# Patient Record
Sex: Female | Born: 1965 | Race: White | Hispanic: No | Smoking: Never smoker
Health system: Southern US, Community
[De-identification: ages and names within clinical notes are randomized; demographics above are authoritative.]

## PROBLEM LIST (undated history)

## (undated) DIAGNOSIS — N920 Excessive and frequent menstruation with regular cycle: Secondary | ICD-10-CM

## (undated) DIAGNOSIS — K649 Unspecified hemorrhoids: Secondary | ICD-10-CM

## (undated) DIAGNOSIS — R112 Nausea with vomiting, unspecified: Secondary | ICD-10-CM

## (undated) DIAGNOSIS — Z9889 Other specified postprocedural states: Secondary | ICD-10-CM

## (undated) DIAGNOSIS — J45909 Unspecified asthma, uncomplicated: Secondary | ICD-10-CM

## (undated) HISTORY — DX: Excessive and frequent menstruation with regular cycle: N92.0

## (undated) HISTORY — DX: Other specified postprocedural states: R11.2

## (undated) HISTORY — PX: TUBAL LIGATION: SHX77

## (undated) HISTORY — DX: Other specified postprocedural states: Z98.890

## (undated) HISTORY — DX: Unspecified asthma, uncomplicated: J45.909

## (undated) HISTORY — DX: Unspecified hemorrhoids: K64.9

---

## 1997-08-07 HISTORY — PX: AUGMENTATION MAMMAPLASTY: SUR837

## 1998-07-09 ENCOUNTER — Other Ambulatory Visit: Admission: RE | Admit: 1998-07-09 | Discharge: 1998-07-09 | Payer: Self-pay | Admitting: Gynecology

## 1998-08-07 HISTORY — PX: CERVICAL CERCLAGE: SHX1329

## 1998-09-27 ENCOUNTER — Inpatient Hospital Stay (HOSPITAL_COMMUNITY): Admission: AD | Admit: 1998-09-27 | Discharge: 1998-09-30 | Payer: Self-pay | Admitting: Obstetrics and Gynecology

## 1999-02-04 ENCOUNTER — Inpatient Hospital Stay (HOSPITAL_COMMUNITY): Admission: AD | Admit: 1999-02-04 | Discharge: 1999-02-07 | Payer: Self-pay | Admitting: Gynecology

## 1999-03-21 ENCOUNTER — Other Ambulatory Visit: Admission: RE | Admit: 1999-03-21 | Discharge: 1999-03-21 | Payer: Self-pay | Admitting: Obstetrics and Gynecology

## 1999-06-14 ENCOUNTER — Other Ambulatory Visit: Admission: RE | Admit: 1999-06-14 | Discharge: 1999-06-14 | Payer: Self-pay | Admitting: Obstetrics and Gynecology

## 2000-11-01 ENCOUNTER — Other Ambulatory Visit: Admission: RE | Admit: 2000-11-01 | Discharge: 2000-11-01 | Payer: Self-pay | Admitting: Gynecology

## 2000-12-25 ENCOUNTER — Ambulatory Visit (HOSPITAL_COMMUNITY): Admission: RE | Admit: 2000-12-25 | Discharge: 2000-12-25 | Payer: Self-pay | Admitting: Gynecology

## 2001-06-19 ENCOUNTER — Encounter (INDEPENDENT_AMBULATORY_CARE_PROVIDER_SITE_OTHER): Payer: Self-pay | Admitting: *Deleted

## 2001-06-19 ENCOUNTER — Inpatient Hospital Stay (HOSPITAL_COMMUNITY): Admission: AD | Admit: 2001-06-19 | Discharge: 2001-06-21 | Payer: Self-pay | Admitting: Gynecology

## 2001-07-29 ENCOUNTER — Other Ambulatory Visit: Admission: RE | Admit: 2001-07-29 | Discharge: 2001-07-29 | Payer: Self-pay | Admitting: Gynecology

## 2001-08-01 ENCOUNTER — Ambulatory Visit (HOSPITAL_COMMUNITY): Admission: RE | Admit: 2001-08-01 | Discharge: 2001-08-01 | Payer: Self-pay | Admitting: Gynecology

## 2002-03-11 ENCOUNTER — Encounter: Payer: Self-pay | Admitting: Gynecology

## 2002-03-11 ENCOUNTER — Ambulatory Visit (HOSPITAL_COMMUNITY): Admission: RE | Admit: 2002-03-11 | Discharge: 2002-03-11 | Payer: Self-pay | Admitting: Gynecology

## 2002-07-21 ENCOUNTER — Encounter: Payer: Self-pay | Admitting: Emergency Medicine

## 2002-07-21 ENCOUNTER — Emergency Department (HOSPITAL_COMMUNITY): Admission: EM | Admit: 2002-07-21 | Discharge: 2002-07-21 | Payer: Self-pay | Admitting: Emergency Medicine

## 2002-12-30 ENCOUNTER — Other Ambulatory Visit: Admission: RE | Admit: 2002-12-30 | Discharge: 2002-12-30 | Payer: Self-pay | Admitting: Gynecology

## 2003-02-03 ENCOUNTER — Encounter: Admission: RE | Admit: 2003-02-03 | Discharge: 2003-02-03 | Payer: Self-pay | Admitting: Gynecology

## 2003-02-03 ENCOUNTER — Encounter: Payer: Self-pay | Admitting: Gynecology

## 2004-02-25 ENCOUNTER — Other Ambulatory Visit: Admission: RE | Admit: 2004-02-25 | Discharge: 2004-02-25 | Payer: Self-pay | Admitting: Gynecology

## 2004-12-20 ENCOUNTER — Ambulatory Visit: Payer: Self-pay | Admitting: Endocrinology

## 2005-05-12 ENCOUNTER — Other Ambulatory Visit: Admission: RE | Admit: 2005-05-12 | Discharge: 2005-05-12 | Payer: Self-pay | Admitting: Gynecology

## 2005-05-22 ENCOUNTER — Ambulatory Visit (HOSPITAL_COMMUNITY): Admission: RE | Admit: 2005-05-22 | Discharge: 2005-05-22 | Payer: Self-pay | Admitting: Gynecology

## 2006-05-22 ENCOUNTER — Other Ambulatory Visit: Admission: RE | Admit: 2006-05-22 | Discharge: 2006-05-22 | Payer: Self-pay | Admitting: Gynecology

## 2007-06-20 ENCOUNTER — Other Ambulatory Visit: Admission: RE | Admit: 2007-06-20 | Discharge: 2007-06-20 | Payer: Self-pay | Admitting: Gynecology

## 2007-09-18 ENCOUNTER — Telehealth (INDEPENDENT_AMBULATORY_CARE_PROVIDER_SITE_OTHER): Payer: Self-pay | Admitting: *Deleted

## 2007-09-19 ENCOUNTER — Encounter: Payer: Self-pay | Admitting: Endocrinology

## 2007-09-19 ENCOUNTER — Ambulatory Visit: Payer: Self-pay | Admitting: Endocrinology

## 2007-09-19 DIAGNOSIS — F419 Anxiety disorder, unspecified: Secondary | ICD-10-CM | POA: Insufficient documentation

## 2007-09-19 DIAGNOSIS — R51 Headache: Secondary | ICD-10-CM | POA: Insufficient documentation

## 2007-09-19 DIAGNOSIS — F411 Generalized anxiety disorder: Secondary | ICD-10-CM

## 2007-09-19 DIAGNOSIS — R519 Headache, unspecified: Secondary | ICD-10-CM | POA: Insufficient documentation

## 2007-09-19 DIAGNOSIS — R079 Chest pain, unspecified: Secondary | ICD-10-CM | POA: Insufficient documentation

## 2007-10-25 ENCOUNTER — Encounter: Payer: Self-pay | Admitting: Endocrinology

## 2008-03-30 ENCOUNTER — Telehealth: Payer: Self-pay | Admitting: Endocrinology

## 2008-03-31 ENCOUNTER — Ambulatory Visit: Payer: Self-pay | Admitting: Endocrinology

## 2008-04-10 ENCOUNTER — Encounter (INDEPENDENT_AMBULATORY_CARE_PROVIDER_SITE_OTHER): Payer: Self-pay | Admitting: *Deleted

## 2008-04-10 ENCOUNTER — Telehealth: Payer: Self-pay | Admitting: Endocrinology

## 2008-05-18 ENCOUNTER — Ambulatory Visit: Payer: Self-pay | Admitting: Endocrinology

## 2008-05-19 LAB — CONVERTED CEMR LAB: TSH: 0.83 microintl units/mL (ref 0.35–5.50)

## 2008-06-12 ENCOUNTER — Telehealth (INDEPENDENT_AMBULATORY_CARE_PROVIDER_SITE_OTHER): Payer: Self-pay | Admitting: *Deleted

## 2008-07-16 ENCOUNTER — Other Ambulatory Visit: Admission: RE | Admit: 2008-07-16 | Discharge: 2008-07-16 | Payer: Self-pay | Admitting: Gynecology

## 2008-07-16 ENCOUNTER — Ambulatory Visit: Payer: Self-pay | Admitting: Women's Health

## 2008-07-16 ENCOUNTER — Encounter: Payer: Self-pay | Admitting: Gynecology

## 2008-07-20 ENCOUNTER — Ambulatory Visit (HOSPITAL_COMMUNITY): Admission: RE | Admit: 2008-07-20 | Discharge: 2008-07-20 | Payer: Self-pay | Admitting: Gynecology

## 2009-04-23 ENCOUNTER — Telehealth: Payer: Self-pay | Admitting: Endocrinology

## 2009-04-26 ENCOUNTER — Ambulatory Visit: Payer: Self-pay | Admitting: Endocrinology

## 2009-04-26 DIAGNOSIS — K5900 Constipation, unspecified: Secondary | ICD-10-CM | POA: Insufficient documentation

## 2009-07-21 ENCOUNTER — Ambulatory Visit (HOSPITAL_COMMUNITY): Admission: RE | Admit: 2009-07-21 | Discharge: 2009-07-21 | Payer: Self-pay | Admitting: Gynecology

## 2009-08-04 ENCOUNTER — Ambulatory Visit: Payer: Self-pay | Admitting: Women's Health

## 2009-08-04 ENCOUNTER — Other Ambulatory Visit: Admission: RE | Admit: 2009-08-04 | Discharge: 2009-08-04 | Payer: Self-pay | Admitting: Gynecology

## 2010-07-27 ENCOUNTER — Ambulatory Visit (HOSPITAL_COMMUNITY)
Admission: RE | Admit: 2010-07-27 | Discharge: 2010-07-27 | Payer: Self-pay | Source: Home / Self Care | Attending: Gynecology | Admitting: Gynecology

## 2010-08-10 ENCOUNTER — Other Ambulatory Visit
Admission: RE | Admit: 2010-08-10 | Discharge: 2010-08-10 | Payer: Self-pay | Source: Home / Self Care | Admitting: Gynecology

## 2010-08-10 ENCOUNTER — Ambulatory Visit
Admission: RE | Admit: 2010-08-10 | Discharge: 2010-08-10 | Payer: Self-pay | Source: Home / Self Care | Attending: Women's Health | Admitting: Women's Health

## 2010-08-19 ENCOUNTER — Ambulatory Visit
Admission: RE | Admit: 2010-08-19 | Discharge: 2010-08-19 | Payer: Self-pay | Source: Home / Self Care | Attending: Gynecology | Admitting: Gynecology

## 2010-09-14 ENCOUNTER — Institutional Professional Consult (permissible substitution): Payer: Self-pay | Admitting: Gynecology

## 2010-09-21 ENCOUNTER — Ambulatory Visit (HOSPITAL_COMMUNITY): Admission: RE | Admit: 2010-09-21 | Payer: Self-pay | Source: Home / Self Care | Admitting: Gynecology

## 2010-12-23 NOTE — Op Note (Signed)
[facility]  Patient:    Jill Kennedy, Jill Kennedy                        MRN: [id number] Proc. Date: 12/25/00 Attending:  Marcial Pacas P. Fontaine, M.D.                           Operative Report  PREOPERATIVE DIAGNOSES:       1. History of incompetent cervix.                               2. Pregnancy at 13 weeks.  POSTOPERATIVE DIAGNOSES:      1. History of incompetent cervix.                               2. Pregnancy at 13 weeks.  PROCEDURE:                    Cervical cerclage placement.  SURGEON:                      Timothy P. Fontaine, M.D.  ANESTHESIA:                   Spinal.  ESTIMATED BLOOD LOSS:         Minimal.  COMPLICATIONS:                None.  SPECIMENS:                    None.  FINDINGS:                     Positive fetal heart tones preoperatively. Uterus appropriate for [redacted] weeks gestation. Cervix closed with good cervical length and single 5 mm Mersilene cerclage placed.  DESCRIPTION OF PROCEDURE:     The patient was taken to the operating room, underwent spinal anesthesia, was placed in the low dorsal lithotomy position, received a perineal/vaginal preparation with Betadine solution, and the patient was draped in the usual fashion. The bladder was empty with in-and-out Foley catheterization. EUA was performed. The cervix was visualized and the vagina was copiously irrigated using sterile saline. The cervix was grasped gently with a ring forcep. Using the 5 mm Mersilene band, the cerclage was placed circumferentially as high as felt safe on the cervix at approximately the same area that the prior cerclage was in place according to the scarring of the mucosa. The cerclage was then tied to support the cervical stroma, although avoiding too tight for strangulation and multiple knots were placed at 12 oclock for later identification. The cervix and vagina was again copiously irrigated using sterile saline, and the instruments were  then removed noting minimal bleeding. The patient did receive IV antibiotic prophylaxis prior to the procedure. The patient was then placed in the supine position and taken to the recovery room in good condition having tolerated the procedure well. DD:  12/25/00 TD:  12/25/00 Job: [id number]

## 2010-12-23 NOTE — Op Note (Signed)
[facility]  Patient:    Jill Kennedy, Jill Kennedy Visit Number: [id number] MRN: [id number]          Service Type: DSU Location: [facility] Attending Physician:  Douglass Rivers Dictated by:   Douglass Rivers, M.D. Proc. Date: 08/01/01 Admit Date:  08/01/2001                             Operative Report  PREOPERATIVE DIAGNOSIS:       Undesired fertility.  POSTOPERATIVE DIAGNOSES:      1. Undesired fertility.                               2. Endometriosis.  PROCEDURE:                    Laparoscopic bilateral tubal ligation with cautery.  SURGEON:                      Douglass Rivers, M.D.  ANESTHESIA:                   General.  ESTIMATED BLOOD LOSS:         Minimal.  FINDINGS:                     Normal uterus, tubes and ovaries.  There was peritoneal endometriosis studding the anterior cul-de-sac and bladder flap.  COMPLICATION:                 Uterine perforation that was hemostatic from the manipulator.  DESCRIPTION OF PROCEDURE:     The patient was taken to the operating room and placed in the dorsal lithotomy position.  She was prepped and draped in the usual sterile fashion.  After general anesthesia, a bivalve speculum was placed in the vagina.  The cervix was visualized and stabilized with a uterine manipulator.  Attention was then turned to the abdomen.  An infraumbilical incision was made with a scalpel, through which a Veress needle was inserted. Opening pressure was 3.  Pneumoperitoneum was created until tympani was appreciated above the liver.  The #10-11 disposable trocar was then inserted through the infraumbilical port.  Placement in the abdominal cavity was confirmed.  The uterus and pelvis was inspected and noted to be remarkable only for some studding of endometriosis on the anterior bladder flap.  The tubes and ovaries were unremarkable except for the right tube was not freely mobile.  The midportion of the left tube was grasped with the  Kleppinger and cauterized in three serial sites.  Attention was then turned toward the right. We were unable to mobilize the tube adequately to cauterize three serial sites.  At this point, while we were trying to manipulate the uterus to better elevate the right adnexa, a small amount of blood was noted on the uterine surface.  A #5 port was made in the midline, through which the suction irrigator was placed.  There was a small amount of bleeding noted from this site, which was treated successfully with cautery.  Reinspection of this noted it to be hemostatic.  An atraumatic grasper was then placed through the suprapubic site.  The tube was elevated and rotated.  Then, we were able to serially cauterize it in three sites with the Kleppinger.  The upper abdomen was inspected and noted to be hemostatic.  We reirrigated  the site of the perforation.  Again, it was noted to be hemostatic, as well.  The pneumoperitoneum was released under direct visualization with minimal gas in the abdomen.  Again, the uterine lesion was inspected and hemostasis was assured.  The infraumbilical instruments were then removed.  The infraumbilical port was closed with a figure-of-eight of 0 Vicryl.  The skin was closed with 4-0 plain.  Both ports were injected with 0.25% Marcaine solution for a total of 7 cc.  The suprapubic port was reapproximated with tincture of benzoin and Steri-Strips.  The instruments were then removed from the vagina.  There was a small amount of bleeding noted from the cervix internally, but not where the manipulator was placed externally.  The patient tolerated the procedure well.  Sponge, lap, and needle counts were correct x 2.  She was transferred to the recovery room in stable condition. Dictated by:   Douglass Rivers, M.D. Attending Physician:  Douglass Rivers DD:  08/01/01 TD:  08/01/01 Job: [id number]

## 2010-12-23 NOTE — Discharge Summary (Signed)
[facility]  Patient:    Jill Kennedy, Jill Kennedy Visit Number: [id number] MRN: [id number]          Service Type: OBS Location: 910A [id number] [id number] Attending Physician:  Douglass Rivers Dictated by:   Antony Contras, N.P. Admit Date:  06/19/2001 Discharge Date: 06/21/2001                             Discharge Summary  DISCHARGE DIAGNOSES:          1. Intrauterine pregnancy at 39 weeks.                               2. History of prophylactic cerclage due to past                                  history of preterm labor and short cervix.  PROCEDURES:                   1. Normal spontaneous vaginal delivery of viable                                  infant over intact perineum with repair of                                  second-degree laceration.  HISTORY OF PRESENT ILLNESS:   The patient is a 45 year old gravida 4, para 1-0-2-1 with an LMP of September 21, 2000.  [facility] June 27, 2001.  Prenatal course was complicated by history of preterm cervical changes with patients prior pregnancy, which was managed by placement of an emergent cerclage. Prophylactic cerclage was placed with this pregnancy and removed at 38 weeks.  LABORATORY/ACCESSORY DATA:    Blood type A-positive.  Antibody screen negative.  RPR, HBsAg, HIV nonreactive.  MSAFP normal.  HOSPITAL COURSE/TREATMENT:    Patient was admitted on June 19, 2001 with spontaneous onset of labor.  Cervix on admission was 4 cm/100%/-1 station. Patient did progress to complete dilatation.  Delivered an Apgar 6/8 female infant, weighed 8 pounds 11 ounces over intact perineum with repair of second-degree laceration.  POSTPARTUM COURSE:            Patient remained afebrile.  Was able to be discharged in satisfactory condition on her second postpartum day.  CBC: Hematocrit 31.2, hemoglobin 10.8, WBC 12, platelets 17,200.  DISPOSITION:                  Follow up in six weeks.  Continue prenatal vitamins and iron.   Motrin for pain. Dictated by:   Antony Contras, N.P. Attending Physician:  Douglass Rivers DD:  07/12/01 TD:  07/13/01 Job: [id number]

## 2010-12-23 NOTE — H&P (Signed)
[facility]  Patient:    Jill Kennedy, Jill Kennedy                        MRN: [id number] Adm. Date:  12/25/00 Attending:  Marcial Pacas P. Fontaine, M.D.                         History and Physical  CHIEF COMPLAINT:              1. Pregnancy, [redacted] weeks gestation.                               2. History of prior cervical incompetence,                                  status post cerclage with successful outcome                                  admitted for cerclage placement  HISTORY OF PRESENT ILLNESS:   The patient is a 45 year old, G58, P31, AB2 female with a history of preterm cervical dilatation with her last pregnancy, status post cerclage placement with successful term delivery.  The patient is admitted at this time for prophylactic cerclage placement.  PAST MEDICAL HISTORY:         Uncomplicated.  PAST SURGICAL HISTORY:        Breast augmentation, cerclage placement.  ALLERGIES:                    None.  REVIEW OF SYSTEMS:            Noncontributory.  FAMILY HISTORY:               Noncontributory.  SOCIAL HISTORY:               Noncontributory.  PHYSICAL EXAMINATION:  VITAL SIGNS:                  Afebrile.  Vital signs are stable.  HEENT:                        Normal.  LUNGS:                        Clear.  CARDIAC:                      Regular rate.  No rubs, murmurs, or gallops.  ABDOMEN:                      Examination benign.  PELVIC:                       A 13-14 week size uterus, cervix long and closed.  Positive fetal heart tones.  ASSESSMENT/PLAN:              A 45 year old, gravida 4, para 1, abortus 2 female, at [redacted] weeks gestation, history of preterm cervical dilatation with prior pregnancy, status post cerclage placement with successful outcome for prophylactic cerclage.  The risks, benefits, indications and alternatives for the procedure were reviewed with her.  I reviewed the expected intraoperative and postoperative courses.  I  discussed the immediate risks to include hemorrhage, transfusion, damage to surrounding tissues including bladder, rectum, and intestines requiring future reparative surgeries, all which was understood and expected.  The patient understands the risks of immediate loss of pregnancy as a result of a complication of the cerclage to include rupture of membranes, infection, cervical damage, as well as the potential for delayed loss both in the previable as well as in the periviable time frame as a direct complication of cerclage or as a failure of the cerclage.  No guarantees as far as success were made and the patient clearly understands this and wants to proceed with surgery.  The patients questions were answered to her satisfaction and she is ready to proceed. DD:  12/22/00 TD:  12/22/00 Job: [id number]

## 2011-07-03 ENCOUNTER — Telehealth: Payer: Self-pay

## 2011-07-03 MED ORDER — ALPRAZOLAM 0.25 MG PO TABS: 0.2500 mg | ORAL_TABLET | Freq: Three times a day (TID) | ORAL | Status: AC | PRN

## 2011-07-03 NOTE — Telephone Encounter (Signed)
RX CALLED IN

## 2011-07-03 NOTE — Telephone Encounter (Signed)
Jill Kennedy, please call in Xanax 0.25 one every 8 hours when necessary #30 with 1 refill. Her pharmacy of choice is [facility].  I called patient to discuss problem, she does have an annual in January where we will evaluate.

## 2011-07-03 NOTE — Telephone Encounter (Addendum)
PT. WANTS TODISCUSS WHAT TO DO ABOUT HER "HIGH ANXIETY".

## 2011-07-03 NOTE — Telephone Encounter (Signed)
Telephone call to review symptoms. Has used Effexor and Zoloft in the past. Strongly encourage counseling, states can't afford, had to file bankruptcy. States is in a safe place and children are fine. Denies any feelings of harming self or others. States does not want to take medication daily. Did review importance of exercise, leisure, has used Xanax in the past on occasion. Will try xanax, reviewed addictive properties.

## 2011-08-07 DIAGNOSIS — F329 Major depressive disorder, single episode, unspecified: Secondary | ICD-10-CM | POA: Insufficient documentation

## 2011-08-07 DIAGNOSIS — F32A Depression, unspecified: Secondary | ICD-10-CM | POA: Insufficient documentation

## 2011-08-16 ENCOUNTER — Encounter: Payer: Self-pay | Admitting: Women's Health

## 2011-08-30 ENCOUNTER — Encounter: Payer: Self-pay | Admitting: Women's Health

## 2011-09-06 ENCOUNTER — Encounter: Payer: Self-pay | Admitting: Women's Health

## 2011-09-06 ENCOUNTER — Ambulatory Visit (INDEPENDENT_AMBULATORY_CARE_PROVIDER_SITE_OTHER): Payer: BC Managed Care – PPO | Admitting: Women's Health

## 2011-09-06 VITALS — BP 100/68 | Ht 67.0 in | Wt 128.0 lb

## 2011-09-06 DIAGNOSIS — Z01419 Encounter for gynecological examination (general) (routine) without abnormal findings: Secondary | ICD-10-CM

## 2011-09-06 LAB — URINALYSIS, ROUTINE W REFLEX MICROSCOPIC
Bilirubin Urine: NEGATIVE
Leukocytes, UA: NEGATIVE
Nitrite: NEGATIVE
Protein, ur: NEGATIVE mg/dL
Specific Gravity, Urine: 1.02 (ref 1.005–1.030)
Urobilinogen, UA: 0.2 mg/dL (ref 0.0–1.0)

## 2011-09-06 NOTE — Progress Notes (Signed)
Jill Kennedy 09/09/1965 [id number]    History:    The patient presents for annual exam.  Regular monthly 5-6 day cycle first 2 days heavy. History of a BTL. History of normal Paps and mammograms. Complaint of decreased libido, states sexual needs do not match partner, states is a good relationship.. Same partner.  Past medical history, past surgical history, family history and social history were all reviewed and documented in the EPIC chart. Sons ages 33 and 61. [employer].   ROS:  A  ROS was performed and pertinent positives and negatives are included in the history.  Exam:  Filed Vitals:   09/06/11 1105  BP: 100/68    General appearance:  Normal Head/Neck:  Normal, without cervical or supraclavicular adenopathy. Thyroid:  Symmetrical, normal in size, without palpable masses or nodularity. Respiratory  Effort:  Normal  Auscultation:  Clear without wheezing or rhonchi Cardiovascular  Auscultation:  Regular rate, without rubs, murmurs or gallops  Edema/varicosities:  Not grossly evident Abdominal  Soft,nontender, without masses, guarding or rebound.  Liver/spleen:  No organomegaly noted  Hernia:  None appreciated  Skin  Inspection:  Grossly normal  Palpation:  Grossly normal Neurologic/psychiatric  Orientation:  Normal with appropriate conversation.  Mood/affect:  Normal  Genitourinary    Breasts: Examined lying and sitting/augmented.     Right: Without masses, retractions, discharge or axillary adenopathy.     Left: Without masses, retractions, discharge or axillary adenopathy.   Inguinal/mons:  Normal without inguinal adenopathy  External genitalia:  Normal  BUS/Urethra/Skene's glands:  Normal  Bladder:  Normal  Vagina:  Normal  Cervix:  Normal  Uterus:  normal in size, shape and contour.  Midline and mobile  Adnexa/parametria:     Rt: Without masses or tenderness.   Lt: Without masses or tenderness.  Anus and perineum: Normal  Digital rectal  exam: Normal sphincter tone without palpated masses or tenderness  Assessment/Plan:  46 y.o. DW F G2 P2 for annual exam with no complaints other than partners libido does not match her needs.  Menorrhagia 2 days of six-day cycle History of anxiety and depression-occasional Xanax only  Plan: Discussed importance of open dialogue with partner in relationship to frequency of intercourse. SBEs, annual mammogram which is due, will schedule. Continue exercise, active lifestyle, calcium rich diet encouraged. HER option was discussed in the past, insurance does not cover. Encouraged Motrin with cycle. CBC, UA    YOUNG,NANCY J WHNP, 1:11 PM 09/06/2011

## 2011-09-11 ENCOUNTER — Telehealth: Payer: Self-pay | Admitting: *Deleted

## 2011-09-11 NOTE — Telephone Encounter (Signed)
Pt was called bc she didn't stop by the lab for her CBC. Jill Kennedy did not see a history of anemia in her history intake. The patient was advised with the heavy periods that it would be a good idea to RTO to check the CBC. The pt will return next wed 09/20/11 to have her CBC checked. Order already placed. Pt stated she does want this test done.

## 2011-09-18 NOTE — Progress Notes (Signed)
Patient only submitted urine sample. Never came to lab for blood work. ncs 09/18/11

## 2011-09-27 NOTE — Progress Notes (Signed)
Addended by: Richardson Chiquito on: 09/27/2011 08:32 AM   Modules accepted: Orders

## 2011-09-27 NOTE — Progress Notes (Signed)
Addended by: Richardson Chiquito on: 09/27/2011 08:23 AM   Modules accepted: Orders

## 2011-11-22 ENCOUNTER — Other Ambulatory Visit: Payer: Self-pay | Admitting: Women's Health

## 2011-11-22 DIAGNOSIS — Z1231 Encounter for screening mammogram for malignant neoplasm of breast: Secondary | ICD-10-CM

## 2011-11-27 ENCOUNTER — Telehealth: Payer: Self-pay | Admitting: *Deleted

## 2011-11-27 DIAGNOSIS — Z0000 Encounter for general adult medical examination without abnormal findings: Secondary | ICD-10-CM

## 2011-11-27 NOTE — Telephone Encounter (Signed)
Lab orders placed into Epic for upcoming CPX appointment.  

## 2011-12-07 ENCOUNTER — Other Ambulatory Visit (INDEPENDENT_AMBULATORY_CARE_PROVIDER_SITE_OTHER): Payer: BC Managed Care – PPO

## 2011-12-07 DIAGNOSIS — Z0000 Encounter for general adult medical examination without abnormal findings: Secondary | ICD-10-CM

## 2011-12-07 LAB — CBC WITH DIFFERENTIAL/PLATELET
Basophils Absolute: 0 10*3/uL (ref 0.0–0.1)
Eosinophils Relative: 3.3 % (ref 0.0–5.0)
Lymphocytes Relative: 39.9 % (ref 12.0–46.0)
Lymphs Abs: 2.1 10*3/uL (ref 0.7–4.0)
Monocytes Relative: 7.5 % (ref 3.0–12.0)
Neutrophils Relative %: 48.8 % (ref 43.0–77.0)
Platelets: 262 10*3/uL (ref 150.0–400.0)
RDW: 14.4 % (ref 11.5–14.6)
WBC: 5.3 10*3/uL (ref 4.5–10.5)

## 2011-12-07 LAB — URINALYSIS, ROUTINE W REFLEX MICROSCOPIC
Bilirubin Urine: NEGATIVE
Hgb urine dipstick: NEGATIVE
Ketones, ur: NEGATIVE
Leukocytes, UA: NEGATIVE
Total Protein, Urine: NEGATIVE
pH: 6 (ref 5.0–8.0)

## 2011-12-07 LAB — HEPATIC FUNCTION PANEL
ALT: 20 U/L (ref 0–35)
AST: 21 U/L (ref 0–37)
Bilirubin, Direct: 0.1 mg/dL (ref 0.0–0.3)
Total Bilirubin: 0.8 mg/dL (ref 0.3–1.2)
Total Protein: 7.8 g/dL (ref 6.0–8.3)

## 2011-12-07 LAB — BASIC METABOLIC PANEL
CO2: 27 mEq/L (ref 19–32)
Calcium: 9.9 mg/dL (ref 8.4–10.5)
Glucose, Bld: 87 mg/dL (ref 70–99)
Potassium: 5.4 mEq/L — ABNORMAL HIGH (ref 3.5–5.1)
Sodium: 141 mEq/L (ref 135–145)

## 2011-12-07 LAB — LIPID PANEL
HDL: 73.4 mg/dL (ref >39.00)
Total CHOL/HDL Ratio: 3
Triglycerides: 100 mg/dL (ref 0.0–149.0)
VLDL: 20 mg/dL (ref 0.0–40.0)

## 2011-12-07 LAB — TSH: TSH: 1.32 u[IU]/mL (ref 0.35–5.50)

## 2011-12-13 ENCOUNTER — Encounter: Payer: Self-pay | Admitting: Endocrinology

## 2011-12-13 ENCOUNTER — Ambulatory Visit (INDEPENDENT_AMBULATORY_CARE_PROVIDER_SITE_OTHER): Payer: BC Managed Care – PPO | Admitting: Endocrinology

## 2011-12-13 DIAGNOSIS — R079 Chest pain, unspecified: Secondary | ICD-10-CM

## 2011-12-13 DIAGNOSIS — Z0000 Encounter for general adult medical examination without abnormal findings: Secondary | ICD-10-CM

## 2011-12-13 MED ORDER — ALPRAZOLAM 0.25 MG PO TABS: 0.2500 mg | ORAL_TABLET | Freq: Three times a day (TID) | ORAL | Status: AC | PRN
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
## 2011-12-13 NOTE — Patient Instructions (Signed)
please consider these measures for your health:  minimize alcohol.  do not use tobacco products.  have a colonoscopy at least every 10 years from age 45.  Women should have an annual mammogram from age 34.  keep firearms safely stored.  always use seat belts.  have working smoke alarms in your home.  see an eye doctor and dentist regularly.  never drive under the influence of alcohol or drugs (including prescription drugs).  those with fair skin should take precautions against the sun. Here is a prescription to take as needed for anxiety. Please return in 1 year.

## 2011-12-18 ENCOUNTER — Ambulatory Visit (HOSPITAL_COMMUNITY): Payer: BC Managed Care – PPO

## 2012-01-11 ENCOUNTER — Ambulatory Visit (HOSPITAL_COMMUNITY)
Admission: RE | Admit: 2012-01-11 | Discharge: 2012-01-11 | Disposition: A | Discharge status: Home / Self Care | Payer: BC Managed Care – PPO | Source: Ambulatory Visit | Attending: Women's Health | Admitting: Women's Health

## 2012-01-11 ENCOUNTER — Other Ambulatory Visit: Payer: Self-pay | Admitting: Gynecology

## 2012-01-11 DIAGNOSIS — Z1231 Encounter for screening mammogram for malignant neoplasm of breast: Secondary | ICD-10-CM

## 2012-01-11 DIAGNOSIS — Z803 Family history of malignant neoplasm of breast: Secondary | ICD-10-CM

## 2012-01-16 ENCOUNTER — Ambulatory Visit
Admission: RE | Admit: 2012-01-16 | Discharge: 2012-01-16 | Disposition: A | Discharge status: Home / Self Care | Payer: BC Managed Care – PPO | Source: Ambulatory Visit | Attending: Gynecology | Admitting: Gynecology

## 2012-01-16 DIAGNOSIS — Z1231 Encounter for screening mammogram for malignant neoplasm of breast: Secondary | ICD-10-CM

## 2012-01-16 DIAGNOSIS — Z803 Family history of malignant neoplasm of breast: Secondary | ICD-10-CM

## 2012-02-16 ENCOUNTER — Ambulatory Visit (INDEPENDENT_AMBULATORY_CARE_PROVIDER_SITE_OTHER): Payer: BC Managed Care – PPO | Admitting: Gynecology

## 2012-02-16 ENCOUNTER — Encounter: Payer: Self-pay | Admitting: Gynecology

## 2012-02-16 DIAGNOSIS — R3915 Urgency of urination: Secondary | ICD-10-CM

## 2012-02-16 DIAGNOSIS — N390 Urinary tract infection, site not specified: Secondary | ICD-10-CM

## 2012-02-16 LAB — URINALYSIS W MICROSCOPIC + REFLEX CULTURE
Bilirubin Urine: NEGATIVE
Glucose, UA: NEGATIVE mg/dL
Protein, ur: NEGATIVE mg/dL
Urobilinogen, UA: 0.2 mg/dL (ref 0.0–1.0)

## 2012-02-16 MED ORDER — SULFAMETHOXAZOLE-TRIMETHOPRIM 800-160 MG PO TABS: 1.0000 | ORAL_TABLET | Freq: Two times a day (BID) | ORAL | Status: AC

## 2012-02-16 NOTE — Progress Notes (Signed)
Patient presents complaining of some urgency symptoms as well as incomplete voiding. No fever, chills, nausea, vomiting, diarrhea, constipation. No dysuria or abdominal pain.  Currently on her menses.  Exam Spine straight without CVA tenderness Abdomen soft nontender without masses guarding rebound organomegaly.  Urinalysis: 0-2 WBC, 3-6 RBC, few epithelial, few bacteria.  Assessment and plan: Probable low-grade UTI. Will cover with Septra DS one by mouth twice a day x3 days. Follow up if symptoms persist or recur.

## 2012-02-16 NOTE — Patient Instructions (Signed)
Take Septra antibiotics twice daily for 3 days. Call if your symptoms persist or recur.

## 2012-02-20 ENCOUNTER — Other Ambulatory Visit: Payer: Self-pay | Admitting: Gynecology

## 2012-02-20 DIAGNOSIS — R319 Hematuria, unspecified: Secondary | ICD-10-CM

## 2012-02-20 LAB — URINE CULTURE
Colony Count: NO GROWTH
Organism ID, Bacteria: NO GROWTH

## 2012-03-07 ENCOUNTER — Other Ambulatory Visit: Payer: BC Managed Care – PPO

## 2012-03-07 DIAGNOSIS — R319 Hematuria, unspecified: Secondary | ICD-10-CM

## 2012-03-07 DIAGNOSIS — Z01419 Encounter for gynecological examination (general) (routine) without abnormal findings: Secondary | ICD-10-CM

## 2012-03-07 LAB — CBC WITH DIFFERENTIAL/PLATELET
Basophils Absolute: 0 10*3/uL (ref 0.0–0.1)
Basophils Relative: 0 % (ref 0–1)
MCHC: 34.5 g/dL (ref 30.0–36.0)
Monocytes Absolute: 0.4 10*3/uL (ref 0.1–1.0)
Neutro Abs: 4.2 10*3/uL (ref 1.7–7.7)
Neutrophils Relative %: 67 % (ref 43–77)
Platelets: 305 10*3/uL (ref 150–400)
RDW: 14.3 % (ref 11.5–15.5)

## 2012-03-07 LAB — URINALYSIS W MICROSCOPIC + REFLEX CULTURE
Bilirubin Urine: NEGATIVE
Protein, ur: NEGATIVE mg/dL
Urobilinogen, UA: 0.2 mg/dL (ref 0.0–1.0)

## 2012-10-10 ENCOUNTER — Ambulatory Visit (INDEPENDENT_AMBULATORY_CARE_PROVIDER_SITE_OTHER): Payer: BC Managed Care – PPO | Admitting: Women's Health

## 2012-10-10 ENCOUNTER — Encounter: Payer: Self-pay | Admitting: Women's Health

## 2012-10-10 VITALS — BP 108/70 | Ht 67.0 in | Wt 136.0 lb

## 2012-10-10 DIAGNOSIS — Z833 Family history of diabetes mellitus: Secondary | ICD-10-CM

## 2012-10-10 DIAGNOSIS — Z01419 Encounter for gynecological examination (general) (routine) without abnormal findings: Secondary | ICD-10-CM

## 2012-10-10 LAB — CBC WITH DIFFERENTIAL/PLATELET
Basophils Absolute: 0 10*3/uL (ref 0.0–0.1)
Eosinophils Absolute: 0.1 10*3/uL (ref 0.0–0.7)
Eosinophils Relative: 4 % (ref 0–5)
Lymphocytes Relative: 45 % (ref 12–46)
MCH: 29 pg (ref 26.0–34.0)
MCV: 85.9 fL (ref 78.0–100.0)
Platelets: 284 10*3/uL (ref 150–400)
RDW: 14.1 % (ref 11.5–15.5)
WBC: 3.8 10*3/uL — ABNORMAL LOW (ref 4.0–10.5)
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
## 2012-10-10 NOTE — Patient Instructions (Addendum)

## 2012-10-12 LAB — URINALYSIS W MICROSCOPIC + REFLEX CULTURE
Bacteria, UA: NONE SEEN
Bilirubin Urine: NEGATIVE
Hgb urine dipstick: NEGATIVE
Ketones, ur: NEGATIVE mg/dL
Protein, ur: NEGATIVE mg/dL
Urobilinogen, UA: 0.2 mg/dL (ref 0.0–1.0)

## 2012-10-16 ENCOUNTER — Encounter: Payer: Self-pay | Admitting: Gynecology

## 2012-10-18 ENCOUNTER — Encounter: Payer: Self-pay | Admitting: *Deleted

## 2012-11-04 ENCOUNTER — Encounter: Payer: Self-pay | Admitting: Women's Health

## 2013-01-27 ENCOUNTER — Telehealth: Payer: Self-pay | Admitting: *Deleted

## 2013-01-27 NOTE — Telephone Encounter (Signed)
Pt called c/o feeling very irritated, mean, has anxiety. 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 Pt said she can go 0-100 in a sec, hot at night no sweating, can't get comfort. Annual was in 10/10/12. Call back # if needed [phone].  Please advise OV?

## 2013-01-27 NOTE — Telephone Encounter (Signed)
Office visit best, will check some labs. She may need some medication.

## 2013-01-27 NOTE — Telephone Encounter (Signed)
Left on voicemail OV per nancy.

## 2013-02-18 ENCOUNTER — Other Ambulatory Visit: Payer: Self-pay

## 2013-02-18 DIAGNOSIS — Z1231 Encounter for screening mammogram for malignant neoplasm of breast: Secondary | ICD-10-CM

## 2013-03-11 ENCOUNTER — Ambulatory Visit
Admission: RE | Admit: 2013-03-11 | Discharge: 2013-03-11 | Disposition: A | Discharge status: Home / Self Care | Payer: BC Managed Care – PPO | Source: Ambulatory Visit

## 2013-03-11 DIAGNOSIS — Z1231 Encounter for screening mammogram for malignant neoplasm of breast: Secondary | ICD-10-CM

## 2013-03-28 ENCOUNTER — Emergency Department: Payer: Self-pay | Admitting: Emergency Medicine

## 2013-06-24 ENCOUNTER — Other Ambulatory Visit: Payer: Self-pay | Admitting: Women's Health

## 2013-06-24 ENCOUNTER — Telehealth: Payer: Self-pay | Admitting: *Deleted

## 2013-06-24 MED ORDER — NORETHIN-ETH ESTRAD-FE BIPHAS 1 MG-10 MCG / 10 MCG PO TABS: 1.0000 | ORAL_TABLET | Freq: Every day | ORAL | Status: DC

## 2013-06-24 NOTE — Telephone Encounter (Signed)
Pt calling c/o having cycle every 2 weeks for the last 2 months. Pt is bleeding now which is very heavy, I explained to pt that OV best, but would check with you to see if you recommend medication to help with bleeding. Pt # if needed [phone]. Please advise

## 2013-06-24 NOTE — Telephone Encounter (Signed)
Telephone call, states cycles are every 21 days or less, had skipped several but now having them more frequent. 5-6 days first 2-3 days very heavy changing every hour. Normal TSH, negative sonohysterogram and biopsy in the past. Had wanted to have an ablation but not covered by insurance. No bleeding between cycles. Same partner. BTL. Options reviewed Motrin, birth control pills, IUD, would like to try birth control pills. Will try lo Loestrin prescription, will mail coupon reviewed not generic, lowest at estrogen available. Had problems with nausea with birth control pills in the past. Reviewed risks of blood clots and strokes. Will evaluate at annual exam in March.

## 2013-11-13 ENCOUNTER — Other Ambulatory Visit (HOSPITAL_COMMUNITY)
Admission: RE | Admit: 2013-11-13 | Discharge: 2013-11-13 | Disposition: A | Discharge status: Home / Self Care | Payer: BC Managed Care – PPO | Source: Ambulatory Visit | Attending: Gynecology | Admitting: Gynecology

## 2013-11-13 ENCOUNTER — Encounter: Payer: Self-pay | Admitting: Women's Health

## 2013-11-13 ENCOUNTER — Ambulatory Visit (INDEPENDENT_AMBULATORY_CARE_PROVIDER_SITE_OTHER): Payer: BC Managed Care – PPO | Admitting: Women's Health

## 2013-11-13 VITALS — BP 118/79 | Ht 68.0 in | Wt 134.4 lb

## 2013-11-13 DIAGNOSIS — N926 Irregular menstruation, unspecified: Secondary | ICD-10-CM

## 2013-11-13 DIAGNOSIS — Z01419 Encounter for gynecological examination (general) (routine) without abnormal findings: Secondary | ICD-10-CM | POA: Insufficient documentation

## 2013-11-13 DIAGNOSIS — F411 Generalized anxiety disorder: Secondary | ICD-10-CM

## 2013-11-13 DIAGNOSIS — Z833 Family history of diabetes mellitus: Secondary | ICD-10-CM

## 2013-11-13 LAB — CBC WITH DIFFERENTIAL/PLATELET
BASOS ABS: 0 10*3/uL (ref 0.0–0.1)
Basophils Relative: 0 % (ref 0–1)
Eosinophils Absolute: 0.1 10*3/uL (ref 0.0–0.7)
Eosinophils Relative: 2 % (ref 0–5)
HEMATOCRIT: 38.1 % (ref 36.0–46.0)
Hemoglobin: 13.2 g/dL (ref 12.0–15.0)
LYMPHS ABS: 1.4 10*3/uL (ref 0.7–4.0)
LYMPHS PCT: 24 % (ref 12–46)
MCH: 28.6 pg (ref 26.0–34.0)
MCHC: 34.6 g/dL (ref 30.0–36.0)
MCV: 82.6 fL (ref 78.0–100.0)
MONO ABS: 0.4 10*3/uL (ref 0.1–1.0)
Monocytes Relative: 6 % (ref 3–12)
NEUTROS ABS: 4 10*3/uL (ref 1.7–7.7)
Neutrophils Relative %: 68 % (ref 43–77)
PLATELETS: 282 10*3/uL (ref 150–400)
RBC: 4.61 MIL/uL (ref 3.87–5.11)
RDW: 14.8 % (ref 11.5–15.5)
WBC: 5.9 10*3/uL (ref 4.0–10.5)

## 2013-11-13 MED ORDER — ESCITALOPRAM OXALATE 10 MG PO TABS: 10.0000 mg | ORAL_TABLET | Freq: Every day | ORAL | Status: DC
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
## 2013-11-14 LAB — URINALYSIS W MICROSCOPIC + REFLEX CULTURE
Bacteria, UA: NONE SEEN
Bilirubin Urine: NEGATIVE
Casts: NONE SEEN
Crystals: NONE SEEN
Glucose, UA: NEGATIVE mg/dL
Hgb urine dipstick: NEGATIVE
Ketones, ur: NEGATIVE mg/dL
LEUKOCYTES UA: NEGATIVE
Nitrite: NEGATIVE
PROTEIN: NEGATIVE mg/dL
SPECIFIC GRAVITY, URINE: 1.02 (ref 1.005–1.030)
SQUAMOUS EPITHELIAL / LPF: NONE SEEN
UROBILINOGEN UA: 0.2 mg/dL (ref 0.0–1.0)
pH: 5.5 (ref 5.0–8.0)

## 2013-11-14 LAB — GLUCOSE, RANDOM: Glucose, Bld: 78 mg/dL (ref 70–99)

## 2013-11-14 LAB — TSH: TSH: 0.563 u[IU]/mL (ref 0.350–4.500)

## 2013-11-14 LAB — FOLLICLE STIMULATING HORMONE: FSH: 45.3 m[IU]/mL

## 2014-04-17 ENCOUNTER — Other Ambulatory Visit: Payer: Self-pay | Admitting: Gynecology

## 2014-04-17 DIAGNOSIS — Z1231 Encounter for screening mammogram for malignant neoplasm of breast: Secondary | ICD-10-CM

## 2014-04-23 ENCOUNTER — Ambulatory Visit (HOSPITAL_COMMUNITY)
Admission: RE | Admit: 2014-04-23 | Discharge: 2014-04-23 | Disposition: A | Discharge status: Home / Self Care | Payer: BC Managed Care – PPO | Source: Ambulatory Visit | Attending: Gynecology | Admitting: Gynecology

## 2014-04-23 DIAGNOSIS — Z1231 Encounter for screening mammogram for malignant neoplasm of breast: Secondary | ICD-10-CM | POA: Insufficient documentation

## 2014-06-08 ENCOUNTER — Encounter: Payer: Self-pay | Admitting: Women's Health

## 2014-09-07 ENCOUNTER — Other Ambulatory Visit: Payer: Self-pay | Admitting: Women's Health

## 2014-09-07 ENCOUNTER — Telehealth: Payer: Self-pay | Admitting: *Deleted

## 2014-09-07 DIAGNOSIS — G4700 Insomnia, unspecified: Secondary | ICD-10-CM

## 2014-09-07 MED ORDER — ZOLPIDEM TARTRATE 10 MG PO TABS: 10.0000 mg | ORAL_TABLET | Freq: Every evening | ORAL | Status: DC | PRN
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
## 2014-09-07 NOTE — Telephone Encounter (Signed)
Pt called c/o anxiety has always had this, but worse now issues with work and father just sentenced at prison. Pt never started the lexapro. Should pt make OV with you? Please advise

## 2014-10-20 ENCOUNTER — Ambulatory Visit (INDEPENDENT_AMBULATORY_CARE_PROVIDER_SITE_OTHER): Payer: BLUE CROSS/BLUE SHIELD | Admitting: Women's Health

## 2014-10-20 ENCOUNTER — Encounter: Payer: Self-pay | Admitting: Women's Health

## 2014-10-20 VITALS — BP 122/80 | Ht 68.0 in | Wt 137.0 lb

## 2014-10-20 DIAGNOSIS — F411 Generalized anxiety disorder: Secondary | ICD-10-CM | POA: Diagnosis not present

## 2014-10-20 MED ORDER — ESCITALOPRAM OXALATE 10 MG PO TABS: 10.0000 mg | ORAL_TABLET | Freq: Every day | ORAL | Status: DC

## 2014-10-20 MED ORDER — ALPRAZOLAM 0.25 MG PO TABS: 0.2500 mg | ORAL_TABLET | Freq: Every evening | ORAL | Status: DC | PRN

## 2014-10-20 MED ORDER — ESCITALOPRAM OXALATE 20 MG PO TABS: 20.0000 mg | ORAL_TABLET | Freq: Every day | ORAL | Status: DC

## 2014-10-20 NOTE — Patient Instructions (Signed)
Generalized Anxiety Disorder Generalized anxiety disorder (GAD) is a mental disorder. It interferes with life functions, including relationships, work, and school. GAD is different from normal anxiety, which everyone experiences at some point in their lives in response to specific life events and activities. Normal anxiety actually helps us prepare for and get through these life events and activities. Normal anxiety goes away after the event or activity is over.  GAD causes anxiety that is not necessarily related to specific events or activities. It also causes excess anxiety in proportion to specific events or activities. The anxiety associated with GAD is also difficult to control. GAD can vary from mild to severe. People with severe GAD can have intense waves of anxiety with physical symptoms (panic attacks).  SYMPTOMS The anxiety and worry associated with GAD are difficult to control. This anxiety and worry are related to many life events and activities and also occur more days than not for 6 months or longer. People with GAD also have three or more of the following symptoms (one or more in children):  Restlessness.   Fatigue.  Difficulty concentrating.   Irritability.  Muscle tension.  Difficulty sleeping or unsatisfying sleep. DIAGNOSIS GAD is diagnosed through an assessment by your health care provider. Your health care provider will ask you questions aboutyour mood,physical symptoms, and events in your life. Your health care provider may ask you about your medical history and use of alcohol or drugs, including prescription medicines. Your health care provider may also do a physical exam and blood tests. Certain medical conditions and the use of certain substances can cause symptoms similar to those associated with GAD. Your health care provider may refer you to a mental health specialist for further evaluation. TREATMENT The following therapies are usually used to treat GAD:    Medication. Antidepressant medication usually is prescribed for long-term daily control. Antianxiety medicines may be added in severe cases, especially when panic attacks occur.   Talk therapy (psychotherapy). Certain types of talk therapy can be helpful in treating GAD by providing support, education, and guidance. A form of talk therapy called cognitive behavioral therapy can teach you healthy ways to think about and react to daily life events and activities.  Stress managementtechniques. These include yoga, meditation, and exercise and can be very helpful when they are practiced regularly. A mental health specialist can help determine which treatment is best for you. Some people see improvement with one therapy. However, other people require a combination of therapies. Document Released: 11/18/2012 Document Revised: 12/08/2013 Document Reviewed: 11/18/2012 ExitCare Patient Information 2015 [employer]. This information is not intended to replace advice given to you by your health care provider. Make sure you discuss any questions you have with your health care provider.  

## 2014-10-20 NOTE — Progress Notes (Signed)
Patient ID: Jill Kennedy, female   DOB: September 19, 1965, 49 y.o.   MRN: [id number] Presents with complaint of increased anxiety, tearfulness, more emotional dealing with day-to-day life. States has not been able to go to counseling due to cost, has a high deductible. Reports sons are well. Business is struggling, minimal support from children's father. Denies suicidal ideation. Numerous family members with anxiety and depression. States has no family support, minimal family members, and dealing with a father in prison, who she had no contact with until almost 49 years old. States has been prescribed antidepressants in the past but did not take, does not like to take medication, but admits to needing something. States gets short of breath, chest discomfort only when thinking about stressful situation and problems. Denies nausea, numbness, tingling sensation, vertigo. States is able to work full time, attend childrens sports activities with no shortness of breath or chest pain. States shortness of breath and chest pain will resolve with deep breathing and if able to relax.  Exam: Appears well. Well-groomed, good eye contact, lungs clear throughout, blood pressure normal. Tearful most of visit.  Anxiety/depression Situational stress/family  Plan: Again strongly encouraged counseling, [facility] number given instructed to call, may be able to get a discounted service. Reviewed importance of self-care, leisure activities, delegating responsibilities as able, distancing cell from biologic father. Lexapro 10 mg by mouth daily prescription, proper use given and reviewed, increased to 20 mg in 3 weeks if tolerating 10 mg. Xanax 0.25 only as a rescue medication., Has used one prescription over the past 2 years. Aware of addictive properties. Instructed to go to the ER if short of breath with dizziness, tingling, and does not resolve with deep breathing or Xanax.

## 2014-11-19 ENCOUNTER — Encounter: Payer: Self-pay | Admitting: Women's Health

## 2014-11-19 ENCOUNTER — Ambulatory Visit (INDEPENDENT_AMBULATORY_CARE_PROVIDER_SITE_OTHER): Payer: BLUE CROSS/BLUE SHIELD | Admitting: Women's Health

## 2014-11-19 VITALS — BP 117/79 | Ht 68.0 in | Wt 132.8 lb

## 2014-11-19 DIAGNOSIS — F32A Depression, unspecified: Secondary | ICD-10-CM

## 2014-11-19 DIAGNOSIS — F411 Generalized anxiety disorder: Secondary | ICD-10-CM | POA: Diagnosis not present

## 2014-11-19 DIAGNOSIS — Z833 Family history of diabetes mellitus: Secondary | ICD-10-CM

## 2014-11-19 DIAGNOSIS — Z01419 Encounter for gynecological examination (general) (routine) without abnormal findings: Secondary | ICD-10-CM | POA: Diagnosis not present

## 2014-11-19 DIAGNOSIS — Z13220 Encounter for screening for lipoid disorders: Secondary | ICD-10-CM | POA: Diagnosis not present

## 2014-11-19 DIAGNOSIS — F329 Major depressive disorder, single episode, unspecified: Secondary | ICD-10-CM

## 2014-11-19 LAB — CBC WITH DIFFERENTIAL/PLATELET
BASOS PCT: 0 % (ref 0–1)
Basophils Absolute: 0 10*3/uL (ref 0.0–0.1)
Eosinophils Absolute: 0.1 10*3/uL (ref 0.0–0.7)
Eosinophils Relative: 2 % (ref 0–5)
HCT: 41.1 % (ref 36.0–46.0)
HEMOGLOBIN: 14 g/dL (ref 12.0–15.0)
LYMPHS ABS: 1.4 10*3/uL (ref 0.7–4.0)
LYMPHS PCT: 26 % (ref 12–46)
MCH: 28.6 pg (ref 26.0–34.0)
MCHC: 34.1 g/dL (ref 30.0–36.0)
MCV: 84 fL (ref 78.0–100.0)
MONO ABS: 0.3 10*3/uL (ref 0.1–1.0)
MONOS PCT: 5 % (ref 3–12)
MPV: 9.5 fL (ref 8.6–12.4)
NEUTROS ABS: 3.7 10*3/uL (ref 1.7–7.7)
NEUTROS PCT: 67 % (ref 43–77)
PLATELETS: 265 10*3/uL (ref 150–400)
RBC: 4.89 MIL/uL (ref 3.87–5.11)
RDW: 13.1 % (ref 11.5–15.5)
WBC: 5.5 10*3/uL (ref 4.0–10.5)

## 2014-11-19 MED ORDER — ESCITALOPRAM OXALATE 20 MG PO TABS: 20.0000 mg | ORAL_TABLET | Freq: Every day | ORAL | Status: DC

## 2014-11-19 NOTE — Progress Notes (Signed)
NONI STONESIFER November 03, 1965 [id number]    History:    Presents for annual exam.  Irregular cycles has not gone a full year without a menstrual cycle but has had an elevated FSH, continues to cycle every 2-3 months/BTL. Same partner. Normal Pap and mammogram history. Recently started on Lexapro 10 mg and has already had improvement of anxiety symptoms. 3 maternal aunts and 2 maternal cousins with breast cancer, has had a negative BRCA 10/2012.  Past medical history, past surgical history, family history and social history were all reviewed and documented in the EPIC chart. Owns 2 laundromat. Sons Josh and Seabrook both doing well, joint custody. Originally from [place].  ROS:  A ROS was performed and pertinent positives and negatives are included.  Exam:  Filed Vitals:   11/19/14 1459  BP: 117/79    General appearance:  Normal Thyroid:  Symmetrical, normal in size, without palpable masses or nodularity. Respiratory  Auscultation:  Clear without wheezing or rhonchi Cardiovascular  Auscultation:  Regular rate, without rubs, murmurs or gallops  Edema/varicosities:  Not grossly evident Abdominal  Soft,nontender, without masses, guarding or rebound.  Liver/spleen:  No organomegaly noted  Hernia:  None appreciated  Skin  Inspection:  Grossly normal   Breasts: Examined lying and sitting/implants.     Right: Without masses, retractions, discharge or axillary adenopathy.     Left: Without masses, retractions, discharge or axillary adenopathy. Gentitourinary   Inguinal/mons:  Normal without inguinal adenopathy  External genitalia:  Normal  BUS/Urethra/Skene's glands:  Normal  Vagina:  Normal  Cervix:  Normal  Uterus:  normal in size, shape and contour.  Midline and mobile  Adnexa/parametria:     Rt: Without masses or tenderness.   Lt: Without masses or tenderness.  Anus and perineum: Normal  Digital rectal exam: Normal sphincter tone without palpated masses or tenderness  Assessment/Plan:   49 y.o. DAF G2P2  for annual exam.   Anxiety stable on Lexapro  Perimenopausal/BTL  Plan: Strongly encouraged counseling, declines, Lexapro 10 mg by mouth daily prescription, proper use given and reviewed. SBE's, continue annual screening mammogram, calcium rich diet, vitamin D 1000 daily encouraged. Encouraged increased leisure activities, exercise. CBC, glucose, lipid panel, UA, Pap normal 2015, new screening guidelines reviewed.  Huel C[facility], 3:47 PM 11/19/2014

## 2014-11-19 NOTE — Patient Instructions (Signed)
Health Recommendations for Postmenopausal Women Respected and ongoing research has looked at the most common causes of death, disability, and poor quality of life in postmenopausal women. The causes include heart disease, diseases of blood vessels, diabetes, depression, cancer, and bone loss (osteoporosis). Many things can be done to help lower the chances of developing these and other common problems. CARDIOVASCULAR DISEASE Heart Disease: A heart attack is a medical emergency. Know the signs and symptoms of a heart attack. Below are things women can do to reduce their risk for heart disease.   Do not smoke. If you smoke, quit.  Aim for a healthy weight. Being overweight causes many preventable deaths. Eat a healthy and balanced diet and drink an adequate amount of liquids.  Get moving. Make a commitment to be more physically active. Aim for 30 minutes of activity on most, if not all days of the week.  Eat for heart health. Choose a diet that is low in saturated fat and cholesterol and eliminate trans fat. Include whole grains, vegetables, and fruits. Read and understand the labels on food containers before buying.  Know your numbers. Ask your caregiver to check your blood pressure, cholesterol (total, HDL, LDL, triglycerides) and blood glucose. Work with your caregiver on improving your entire clinical picture.  High blood pressure. Limit or stop your table salt intake (try salt substitute and food seasonings). Avoid salty foods and drinks. Read labels on food containers before buying. Eating well and exercising can help control high blood pressure. STROKE  Stroke is a medical emergency. Stroke may be the result of a blood clot in a blood vessel in the brain or by a brain hemorrhage (bleeding). Know the signs and symptoms of a stroke. To lower the risk of developing a stroke:  Avoid fatty foods.  Quit smoking.  Control your diabetes, blood pressure, and irregular heart rate. THROMBOPHLEBITIS  (BLOOD CLOT) OF THE LEG  Becoming overweight and leading a stationary lifestyle may also contribute to developing blood clots. Controlling your diet and exercising will help lower the risk of developing blood clots. CANCER SCREENING  Breast Cancer: Take steps to reduce your risk of breast cancer.  You should practice "breast self-awareness." This means understanding the normal appearance and feel of your breasts and should include breast self-examination. Any changes detected, no matter how small, should be reported to your caregiver.  After age 40, you should have a clinical breast exam (CBE) every year.  Starting at age 40, you should consider having a mammogram (breast X-ray) every year.  If you have a family history of breast cancer, talk to your caregiver about genetic screening.  If you are at high risk for breast cancer, talk to your caregiver about having an MRI and a mammogram every year.  Intestinal or Stomach Cancer: Tests to consider are a rectal exam, fecal occult blood, sigmoidoscopy, and colonoscopy. Women who are high risk may need to be screened at an earlier age and more often.  Cervical Cancer:  Beginning at age 30, you should have a Pap test every 3 years as long as the past 3 Pap tests have been normal.  If you have had past treatment for cervical cancer or a condition that could lead to cancer, you need Pap tests and screening for cancer for at least 20 years after your treatment.  If you had a hysterectomy for a problem that was not cancer or a condition that could lead to cancer, then you no longer need Pap tests.    If you are between ages 65 and 70, and you have had normal Pap tests going back 10 years, you no longer need Pap tests.  If Pap tests have been discontinued, risk factors (such as a new sexual partner) need to be reassessed to determine if screening should be resumed.  Some medical problems can increase the chance of getting cervical cancer. In these  cases, your caregiver may recommend more frequent screening and Pap tests.  Uterine Cancer: If you have vaginal bleeding after reaching menopause, you should notify your caregiver.  Ovarian Cancer: Other than yearly pelvic exams, there are no reliable tests available to screen for ovarian cancer at this time except for yearly pelvic exams.  Lung Cancer: Yearly chest X-rays can detect lung cancer and should be done on high risk women, such as cigarette smokers and women with chronic lung disease (emphysema).  Skin Cancer: A complete body skin exam should be done at your yearly examination. Avoid overexposure to the sun and ultraviolet light lamps. Use a strong sun block cream when in the sun. All of these things are important for lowering the risk of skin cancer. MENOPAUSE Menopause Symptoms: Hormone therapy products are effective for treating symptoms associated with menopause:  Moderate to severe hot flashes.  Night sweats.  Mood swings.  Headaches.  Tiredness.  Loss of sex drive.  Insomnia.  Other symptoms. Hormone replacement carries certain risks, especially in older women. Women who use or are thinking about using estrogen or estrogen with progestin treatments should discuss that with their caregiver. Your caregiver will help you understand the benefits and risks. The ideal dose of hormone replacement therapy is not known. The Food and Drug Administration (FDA) has concluded that hormone therapy should be used only at the lowest doses and for the shortest amount of time to reach treatment goals.  OSTEOPOROSIS Protecting Against Bone Loss and Preventing Fracture If you use hormone therapy for prevention of bone loss (osteoporosis), the risks for bone loss must outweigh the risk of the therapy. Ask your caregiver about other medications known to be safe and effective for preventing bone loss and fractures. To guard against bone loss or fractures, the following is recommended:  If  you are younger than age 50, take 1000 mg of calcium and at least 600 mg of Vitamin D per day.  If you are older than age 50 but younger than age 70, take 1200 mg of calcium and at least 600 mg of Vitamin D per day.  If you are older than age 70, take 1200 mg of calcium and at least 800 mg of Vitamin D per day. Smoking and excessive alcohol intake increases the risk of osteoporosis. Eat foods rich in calcium and vitamin D and do weight bearing exercises several times a week as your caregiver suggests. DIABETES Diabetes Mellitus: If you have type I or type 2 diabetes, you should keep your blood sugar under control with diet, exercise, and recommended medication. Avoid starchy and fatty foods, and too many sweets. Being overweight can make diabetes control more difficult. COGNITION AND MEMORY Cognition and Memory: Menopausal hormone therapy is not recommended for the prevention of cognitive disorders such as Alzheimer's disease or memory loss.  DEPRESSION  Depression may occur at any age, but it is common in elderly women. This may be because of physical, medical, social (loneliness), or financial problems and needs. If you are experiencing depression because of medical problems and control of symptoms, talk to your caregiver about this. Physical   activity and exercise may help with mood and sleep. Community and volunteer involvement may improve your sense of value and worth. If you have depression and you feel that the problem is getting worse or becoming severe, talk to your caregiver about which treatment options are best for you. ACCIDENTS  Accidents are common and can be serious in elderly woman. Prepare your house to prevent accidents. Eliminate throw rugs, place hand bars in bath, shower, and toilet areas. Avoid wearing high heeled shoes or walking on wet, snowy, and icy areas. Limit or stop driving if you have vision or hearing problems, or if you feel you are unsteady with your movements and  reflexes. HEPATITIS C Hepatitis C is a type of viral infection affecting the liver. It is spread mainly through contact with blood from an infected person. It can be treated, but if left untreated, it can lead to severe liver damage over the years. Many people who are infected do not know that the virus is in their blood. If you are a "baby-boomer", it is recommended that you have one screening test for Hepatitis C. IMMUNIZATIONS  Several immunizations are important to consider having during your senior years, including:   Tetanus, diphtheria, and pertussis booster shot.  Influenza every year before the flu season begins.  Pneumonia vaccine.  Shingles vaccine.  Others, as indicated based on your specific needs. Talk to your caregiver about these. Document Released: 09/15/2005 Document Revised: 12/08/2013 Document Reviewed: 05/11/2008 ExitCare Patient Information 2015 [employer]. This information is not intended to replace advice given to you by your health care provider. Make sure you discuss any questions you have with your health care provider.  

## 2014-11-20 LAB — GLUCOSE, RANDOM: GLUCOSE: 67 mg/dL — AB (ref 70–99)

## 2014-11-20 LAB — LIPID PANEL
Cholesterol: 193 mg/dL (ref 0–200)
HDL: 60 mg/dL (ref >=46)
LDL CALC: 104 mg/dL — AB (ref 0–99)
TRIGLYCERIDES: 145 mg/dL (ref <150)
Total CHOL/HDL Ratio: 3.2 Ratio
VLDL: 29 mg/dL (ref 0–40)

## 2014-12-04 ENCOUNTER — Other Ambulatory Visit: Payer: Self-pay | Admitting: Women's Health

## 2014-12-04 NOTE — Telephone Encounter (Signed)
Called into pharmacy

## 2015-02-23 ENCOUNTER — Other Ambulatory Visit: Payer: Self-pay | Admitting: Women's Health

## 2015-02-23 ENCOUNTER — Telehealth: Payer: Self-pay | Admitting: *Deleted

## 2015-02-23 MED ORDER — CITALOPRAM HYDROBROMIDE 20 MG PO TABS: 20.0000 mg | ORAL_TABLET | Freq: Every day | ORAL | Status: DC

## 2015-02-23 NOTE — Telephone Encounter (Signed)
Pt was given Rx for lexapro 20 mg on OV /14/16, states she stopped medication one month ago because she had no sex drive. Pt said she needs to be on something, asked if any advice or other medication recommendations. Please advise

## 2015-02-23 NOTE — Telephone Encounter (Signed)
Telephone call, states stopped Lexapro about one month ago is having increased anxiety but libido slightly better for relationship with boyfriend. States no libido when on Lexapro but anxiety better. Would like to try different medication, states knows she needs something. Declines psychiatrist, financial. Will try Celexa 10 mg for several weeks increase to 20 mg. Will call if continued problems with anxiety and libido.

## 2015-04-20 ENCOUNTER — Other Ambulatory Visit: Payer: Self-pay | Admitting: Women's Health

## 2015-04-20 NOTE — Telephone Encounter (Signed)
Yes - thank you

## 2015-04-20 NOTE — Telephone Encounter (Signed)
Called into pharmacy

## 2015-04-20 NOTE — Telephone Encounter (Signed)
Each time is a new Rx. So new Rx with one refill? Correct? (#30 with one refill?)

## 2015-04-20 NOTE — Telephone Encounter (Signed)
Okay for refill and 1 refill

## 2015-04-29 ENCOUNTER — Encounter: Payer: Self-pay | Admitting: Endocrinology

## 2015-04-29 ENCOUNTER — Telehealth: Payer: Self-pay | Admitting: Endocrinology

## 2015-04-29 ENCOUNTER — Ambulatory Visit (INDEPENDENT_AMBULATORY_CARE_PROVIDER_SITE_OTHER): Payer: BLUE CROSS/BLUE SHIELD | Admitting: Endocrinology

## 2015-04-29 ENCOUNTER — Other Ambulatory Visit (INDEPENDENT_AMBULATORY_CARE_PROVIDER_SITE_OTHER): Payer: BLUE CROSS/BLUE SHIELD

## 2015-04-29 VITALS — BP 108/80 | HR 81 | Temp 98.1°F | Ht 68.0 in | Wt 137.0 lb

## 2015-04-29 DIAGNOSIS — M2550 Pain in unspecified joint: Secondary | ICD-10-CM

## 2015-04-29 DIAGNOSIS — Z0000 Encounter for general adult medical examination without abnormal findings: Secondary | ICD-10-CM

## 2015-04-29 DIAGNOSIS — Z0189 Encounter for other specified special examinations: Secondary | ICD-10-CM | POA: Diagnosis not present

## 2015-04-29 LAB — HEPATIC FUNCTION PANEL
ALBUMIN: 4.6 g/dL (ref 3.5–5.2)
ALT: 32 U/L (ref 0–35)
AST: 27 U/L (ref 0–37)
Alkaline Phosphatase: 61 U/L (ref 39–117)
BILIRUBIN TOTAL: 0.6 mg/dL (ref 0.2–1.2)
Bilirubin, Direct: 0.1 mg/dL (ref 0.0–0.3)
Total Protein: 7.9 g/dL (ref 6.0–8.3)

## 2015-04-29 LAB — BASIC METABOLIC PANEL
BUN: 18 mg/dL (ref 6–23)
CALCIUM: 9.6 mg/dL (ref 8.4–10.5)
CO2: 31 mEq/L (ref 19–32)
CREATININE: 0.84 mg/dL (ref 0.40–1.20)
Chloride: 103 mEq/L (ref 96–112)
GFR: 76.9 mL/min (ref >60.00)
Glucose, Bld: 98 mg/dL (ref 70–99)
Potassium: 4 mEq/L (ref 3.5–5.1)
Sodium: 141 mEq/L (ref 135–145)

## 2015-04-29 LAB — URINALYSIS, ROUTINE W REFLEX MICROSCOPIC
BILIRUBIN URINE: NEGATIVE
HGB URINE DIPSTICK: NEGATIVE
KETONES UR: NEGATIVE
NITRITE: NEGATIVE
PH: 6 (ref 5.0–8.0)
RBC / HPF: NONE SEEN (ref 0–?)
Specific Gravity, Urine: 1.02 (ref 1.000–1.030)
TOTAL PROTEIN, URINE-UPE24: NEGATIVE
URINE GLUCOSE: NEGATIVE
Urobilinogen, UA: 0.2 (ref 0.0–1.0)

## 2015-04-29 LAB — TSH: TSH: 0.66 u[IU]/mL (ref 0.35–4.50)

## 2015-04-29 MED ORDER — FLIBANSERIN 100 MG PO TABS: 1.0000 | ORAL_TABLET | Freq: Every day | ORAL | Status: DC

## 2015-04-29 NOTE — Progress Notes (Signed)
Subjective:    Patient ID: Jill Kennedy, female    DOB: 05-03-1966, 49 y.o.   MRN: [id number]  HPI Pt is here for regular wellness examination, and is feeling pretty well in general, and says chronic med probs are stable, except as noted below Past Medical History  Diagnosis Date  . Menorrhagia   . Dysmenorrhea     Past Surgical History  Procedure Laterality Date  . Augmentation mammaplasty    . Tubal ligation    . Cervical cerclage      Social History   Social History  . Marital Status: Divorced    Spouse Name: N/A  . Number of Children: N/A  . Years of Education: N/A   Occupational History  . Not on file.   Social History Main Topics  . Smoking status: Never Smoker   . Smokeless tobacco: Never Used  . Alcohol Use: Yes     Comment: SOCIALLY ONLY  . Drug Use: No  . Sexual Activity: Yes    Birth Control/ Protection: Surgical     Comment: TUBAL LIGATION   Other Topics Concern  . Not on file   Social History Narrative    Current Outpatient Prescriptions on File Prior to Visit  Medication Sig Dispense Refill  . ALPRAZolam (XANAX) 0.25 MG tablet Take 1 tablet (0.25 mg total) by mouth at bedtime as needed for anxiety. 30 tablet 1  . citalopram (CELEXA) 20 MG tablet Take 1 tablet (20 mg total) by mouth daily. 30 tablet 6  . zolpidem (AMBIEN) 10 MG tablet TAKE 1 TABLET AT BEDTIME IF NEEDED FOR SLEEP. 30 tablet 1   No current facility-administered medications on file prior to visit.    No Known Allergies  Family History  Problem Relation Age of Onset  . Breast cancer Mother 7  . Hypertension Mother   . Hypertension Brother   . Diabetes Brother   . Breast cancer Maternal Aunt   . Breast cancer Maternal Aunt   . Breast cancer Maternal Aunt   . Breast cancer Cousin   . Breast cancer Cousin     BP 108/80 mmHg  Pulse 81  Temp(Src) 98.1 F (36.7 C) (Oral)  Ht '5\' 8"'  (1.727 m)  Wt 137 lb (62.143 kg)  BMI 20.84 kg/m2  SpO2 97%   Review of Systems    Constitutional: Negative for unexpected weight change.  HENT: Negative for hearing loss.   Eyes: Negative for visual disturbance.  Respiratory: Negative for shortness of breath.   Cardiovascular: Negative for chest pain.  Gastrointestinal: Negative for anal bleeding.  Endocrine: Negative for cold intolerance.  Genitourinary: Negative for hematuria.  Musculoskeletal: Positive for back pain and neck pain.  Skin: Negative for rash.  Allergic/Immunologic: Negative for environmental allergies.  Neurological: Negative for syncope and numbness.  Hematological: Does not bruise/bleed easily.  Psychiatric/Behavioral:       Depression is well-controlled   She has had no menses x 4 mos.      Objective:   Physical Exam VS: see vs page GEN: no distress HEAD: head: no deformity eyes: no periorbital swelling, no proptosis external nose and ears are normal mouth: no lesion seen NECK: supple, thyroid is not enlarged CHEST WALL: no deformity LUNGS:  Clear to auscultation BREASTS: sees gyn CV: reg rate and rhythm, no murmur ABD: abdomen is soft, nontender.  no hepatosplenomegaly.  not distended.  no hernia GENITALIA/RECTAL: sees gyn MUSCULOSKELETAL: muscle bulk and strength are grossly normal.  no obvious joint swelling.  gait is normal and steady EXTEMITIES: no deformity.  no ulcer on the feet.  feet are of normal color and temp.  no edema PULSES: dorsalis pedis intact bilat.  no carotid bruit NEURO:  cn 2-12 grossly intact.   readily moves all 4's.  sensation is intact to touch on the feet SKIN:  Normal texture and temperature.  No rash or suspicious lesion is visible.   NODES:  None palpable at the neck PSYCH: alert, well-oriented.  Does not appear anxious nor depressed.     i personally reviewed electrocardiogram tracing (today): Indication: wellness Impression: normal     Assessment & Plan:  Wellness visit today, with problems stable, except as noted.   SEPARATE EVALUATION  FOLLOWS--EACH PROBLEM HERE IS NEW, NOT RESPONDING TO TREATMENT, OR POSES SIGNIFICANT RISK TO THE PATIENT'S HEALTH: HISTORY OF THE PRESENT ILLNESS: Pt states 6 weeks of slight arthralgias of the hands (worst at the PIP's), and assoc swelling.   PAST MEDICAL HISTORY reviewed and up to date today REVIEW OF SYSTEMS: She has fatigue PHYSICAL EXAMINATION: VITAL SIGNS:  See vs page GENERAL: no distress Ext: right hand PIP's: slightly swollen LAB/XRAY RESULTS: ESR=normal IMPRESSION: Arthralgias, new, uncertain etiology PLAN: check x-rays

## 2015-04-29 NOTE — Telephone Encounter (Signed)
I tried to reach the pt. I will try again at a later time.

## 2015-04-29 NOTE — Telephone Encounter (Signed)
Kim from [facility] called they do not have that medication Addyi  [phone]

## 2015-04-29 NOTE — Telephone Encounter (Signed)
please call patient: What other pharmacy do you want [place] to try?

## 2015-04-29 NOTE — Patient Instructions (Addendum)
blood tests are requested for you today.  We'll let you know about the results.   please consider these measures for your health:  minimize alcohol.  do not use tobacco products.  have a colonoscopy at least every 10 years from age 48.  keep firearms safely stored.  always use seat belts.  have working smoke alarms in your home.  see an eye doctor and dentist regularly.  never drive under the influence of alcohol or drugs (including prescription drugs).  those with fair skin should take precautions against the sun.   i have sent a prescription to your pharmacy, for "addyi."

## 2015-04-29 NOTE — Telephone Encounter (Signed)
See note below and please advise, Thanks! 

## 2015-04-30 ENCOUNTER — Ambulatory Visit (INDEPENDENT_AMBULATORY_CARE_PROVIDER_SITE_OTHER)
Admission: RE | Admit: 2015-04-30 | Discharge: 2015-04-30 | Disposition: A | Discharge status: Home / Self Care | Payer: BLUE CROSS/BLUE SHIELD | Source: Ambulatory Visit | Attending: Endocrinology | Admitting: Endocrinology

## 2015-04-30 DIAGNOSIS — M2550 Pain in unspecified joint: Secondary | ICD-10-CM

## 2015-04-30 LAB — SEDIMENTATION RATE: SED RATE: 17 mm/h (ref 0–22)

## 2015-04-30 NOTE — Telephone Encounter (Signed)
Pt was informed and she will wit for this pharmacy to get medication in

## 2015-04-30 NOTE — Telephone Encounter (Signed)
Pt has been made aware that the pharmacy can not order this medication for her. She said "ok"

## 2015-05-05 ENCOUNTER — Telehealth: Payer: Self-pay | Admitting: Endocrinology

## 2015-05-05 NOTE — Telephone Encounter (Signed)
Patient is calling for the results of her ultra sound and labs.

## 2015-05-06 ENCOUNTER — Telehealth: Payer: Self-pay

## 2015-05-06 DIAGNOSIS — R5383 Other fatigue: Secondary | ICD-10-CM

## 2015-05-06 NOTE — Telephone Encounter (Signed)
I contacted the pt and advised IBC Panel has been ordered. Pt will call next week to schedule lab appointment.

## 2015-05-06 NOTE — Telephone Encounter (Signed)
Ok, i ordered 

## 2015-05-06 NOTE — Telephone Encounter (Signed)
Pt called and wanted to know if a iron panel could be ordered. Pt was advised that her recent blood tests were good, but she states she is still fatigued.  Please advise if this test can be ordered. Thanks!

## 2015-05-06 NOTE — Telephone Encounter (Signed)
Pt advised that her recent test results were normal. Pt had a right had x-ray done. Pt was advised x-ray report was normal.

## 2015-06-22 ENCOUNTER — Other Ambulatory Visit: Payer: Self-pay | Admitting: Women's Health

## 2015-06-23 NOTE — Telephone Encounter (Signed)
rx called in

## 2015-08-23 ENCOUNTER — Other Ambulatory Visit: Payer: Self-pay | Admitting: Women's Health

## 2015-08-25 ENCOUNTER — Other Ambulatory Visit: Payer: Self-pay | Admitting: Women's Health

## 2015-08-25 NOTE — Telephone Encounter (Signed)
rx called in [facility] 

## 2015-08-26 NOTE — Telephone Encounter (Signed)
Called into pharmacy

## 2015-08-26 NOTE — Telephone Encounter (Signed)
Okay for refill #30 with 1 refill.

## 2015-11-03 ENCOUNTER — Telehealth: Payer: Self-pay

## 2015-11-03 ENCOUNTER — Other Ambulatory Visit: Payer: Self-pay

## 2015-11-03 MED ORDER — ALPRAZOLAM 0.25 MG PO TABS: ORAL_TABLET | ORAL | Status: DC

## 2015-11-03 NOTE — Telephone Encounter (Signed)
appt for CE scheduled.

## 2015-11-03 NOTE — Telephone Encounter (Signed)
I called patient to let her know that [place] received refill request for her and wanted me to remind her that she is due for CE in next couple of weeks and needs to go on and schedule CE.  Patient said she would be happy to schedule.  Front desk was busy so Chauncey [place]. Is going to give her a call back and get her appt scheduled. Rx was sent.

## 2015-11-03 NOTE — Telephone Encounter (Signed)
Rx phoned in to [facility]. 

## 2015-11-03 NOTE — Telephone Encounter (Signed)
Okay for refill and remind her to schedule annual exam appointment if not scheduled.

## 2015-11-10 ENCOUNTER — Other Ambulatory Visit: Payer: Self-pay

## 2015-11-10 DIAGNOSIS — M531 Cervicobrachial syndrome: Secondary | ICD-10-CM | POA: Diagnosis not present

## 2015-11-10 DIAGNOSIS — M9901 Segmental and somatic dysfunction of cervical region: Secondary | ICD-10-CM | POA: Diagnosis not present

## 2015-11-10 DIAGNOSIS — Z1231 Encounter for screening mammogram for malignant neoplasm of breast: Secondary | ICD-10-CM

## 2015-11-10 DIAGNOSIS — M9902 Segmental and somatic dysfunction of thoracic region: Secondary | ICD-10-CM | POA: Diagnosis not present

## 2015-11-10 DIAGNOSIS — Z9882 Breast implant status: Secondary | ICD-10-CM

## 2015-11-23 DIAGNOSIS — M9902 Segmental and somatic dysfunction of thoracic region: Secondary | ICD-10-CM | POA: Diagnosis not present

## 2015-11-23 DIAGNOSIS — M531 Cervicobrachial syndrome: Secondary | ICD-10-CM | POA: Diagnosis not present

## 2015-11-23 DIAGNOSIS — R51 Headache: Secondary | ICD-10-CM | POA: Diagnosis not present

## 2015-11-23 DIAGNOSIS — M9901 Segmental and somatic dysfunction of cervical region: Secondary | ICD-10-CM | POA: Diagnosis not present

## 2015-11-25 ENCOUNTER — Ambulatory Visit
Admission: RE | Admit: 2015-11-25 | Discharge: 2015-11-25 | Disposition: A | Discharge status: Home / Self Care | Payer: BLUE CROSS/BLUE SHIELD | Source: Ambulatory Visit

## 2015-11-25 DIAGNOSIS — Z1231 Encounter for screening mammogram for malignant neoplasm of breast: Secondary | ICD-10-CM

## 2015-11-25 DIAGNOSIS — Z9882 Breast implant status: Secondary | ICD-10-CM

## 2015-11-26 ENCOUNTER — Encounter: Payer: Self-pay | Admitting: Women's Health

## 2015-11-30 DIAGNOSIS — M9902 Segmental and somatic dysfunction of thoracic region: Secondary | ICD-10-CM | POA: Diagnosis not present

## 2015-11-30 DIAGNOSIS — R51 Headache: Secondary | ICD-10-CM | POA: Diagnosis not present

## 2015-11-30 DIAGNOSIS — M531 Cervicobrachial syndrome: Secondary | ICD-10-CM | POA: Diagnosis not present

## 2015-12-02 DIAGNOSIS — M2242 Chondromalacia patellae, left knee: Secondary | ICD-10-CM | POA: Diagnosis not present

## 2015-12-07 DIAGNOSIS — M9901 Segmental and somatic dysfunction of cervical region: Secondary | ICD-10-CM | POA: Diagnosis not present

## 2015-12-07 DIAGNOSIS — M9902 Segmental and somatic dysfunction of thoracic region: Secondary | ICD-10-CM | POA: Diagnosis not present

## 2015-12-07 DIAGNOSIS — M531 Cervicobrachial syndrome: Secondary | ICD-10-CM | POA: Diagnosis not present

## 2015-12-08 ENCOUNTER — Encounter: Payer: Self-pay | Admitting: Women's Health

## 2015-12-08 ENCOUNTER — Ambulatory Visit (INDEPENDENT_AMBULATORY_CARE_PROVIDER_SITE_OTHER): Payer: BLUE CROSS/BLUE SHIELD | Admitting: Women's Health

## 2015-12-08 VITALS — BP 118/78 | Ht 68.0 in | Wt 137.0 lb

## 2015-12-08 DIAGNOSIS — Z113 Encounter for screening for infections with a predominantly sexual mode of transmission: Secondary | ICD-10-CM

## 2015-12-08 DIAGNOSIS — F4322 Adjustment disorder with anxiety: Secondary | ICD-10-CM

## 2015-12-08 DIAGNOSIS — N912 Amenorrhea, unspecified: Secondary | ICD-10-CM | POA: Diagnosis not present

## 2015-12-08 DIAGNOSIS — Z1329 Encounter for screening for other suspected endocrine disorder: Secondary | ICD-10-CM

## 2015-12-08 DIAGNOSIS — Z01419 Encounter for gynecological examination (general) (routine) without abnormal findings: Secondary | ICD-10-CM | POA: Diagnosis not present

## 2015-12-08 LAB — CBC WITH DIFFERENTIAL/PLATELET
BASOS ABS: 0 {cells}/uL (ref 0–200)
BASOS PCT: 0 %
EOS ABS: 159 {cells}/uL (ref 15–500)
Eosinophils Relative: 3 %
HEMATOCRIT: 40.6 % (ref 35.0–45.0)
Hemoglobin: 13.4 g/dL (ref 11.7–15.5)
LYMPHS PCT: 35 %
Lymphs Abs: 1855 cells/uL (ref 850–3900)
MCH: 28.4 pg (ref 27.0–33.0)
MCHC: 33 g/dL (ref 32.0–36.0)
MCV: 86 fL (ref 80.0–100.0)
MONO ABS: 265 {cells}/uL (ref 200–950)
MPV: 9.5 fL (ref 7.5–12.5)
Monocytes Relative: 5 %
NEUTROS ABS: 3021 {cells}/uL (ref 1500–7800)
Neutrophils Relative %: 57 %
Platelets: 273 10*3/uL (ref 140–400)
RBC: 4.72 MIL/uL (ref 3.80–5.10)
RDW: 14.6 % (ref 11.0–15.0)
WBC: 5.3 10*3/uL (ref 3.8–10.8)

## 2015-12-08 LAB — COMPREHENSIVE METABOLIC PANEL
ALK PHOS: 87 U/L (ref 33–115)
ALT: 21 U/L (ref 6–29)
AST: 21 U/L (ref 10–35)
Albumin: 4.4 g/dL (ref 3.6–5.1)
BUN: 27 mg/dL — ABNORMAL HIGH (ref 7–25)
CALCIUM: 9.2 mg/dL (ref 8.6–10.2)
CO2: 26 mmol/L (ref 20–31)
Chloride: 103 mmol/L (ref 98–110)
Creat: 1.09 mg/dL (ref 0.50–1.10)
Glucose, Bld: 81 mg/dL (ref 65–99)
POTASSIUM: 4 mmol/L (ref 3.5–5.3)
Sodium: 140 mmol/L (ref 135–146)
TOTAL PROTEIN: 7.2 g/dL (ref 6.1–8.1)
Total Bilirubin: 0.5 mg/dL (ref 0.2–1.2)

## 2015-12-08 LAB — TSH: TSH: 1.15 m[IU]/L

## 2015-12-08 LAB — HEPATITIS C ANTIBODY: HCV Ab: NEGATIVE

## 2015-12-08 LAB — HEPATITIS B SURFACE ANTIGEN: HEP B S AG: NEGATIVE

## 2015-12-08 MED ORDER — CITALOPRAM HYDROBROMIDE 20 MG PO TABS: 20.0000 mg | ORAL_TABLET | Freq: Every day | ORAL | Status: DC

## 2015-12-08 NOTE — Progress Notes (Signed)
Jill Kennedy Sep 15, 1965 [id number]    History:    Presents for annual exam.  2016 cycles were every 2-3 months has not had a cycle for over 8 months had spotting last week for 3 days. Prior to the spotting had breast tenderness and abdominal cramping as if to start a cycle. BTL. Same partner. Normal Pap and mammogram history. History of anxiety on Celexa doing well. 3 maternal aunts in 2 maternal cousins breast cancer had negative BRCA 10/2012.  Past medical history, past surgical history, family history and social history were all reviewed and documented in the EPIC chart. From [place]. 2 sons joint custody. Owns 2 laundromat's.  ROS:  A ROS was performed and pertinent positives and negatives are included.  Exam:  Filed Vitals:   12/08/15 1424  BP: 118/78    General appearance:  Normal Thyroid:  Symmetrical, normal in size, without palpable masses or nodularity. Respiratory  Auscultation:  Clear without wheezing or rhonchi Cardiovascular  Auscultation:  Regular rate, without rubs, murmurs or gallops  Edema/varicosities:  Not grossly evident Abdominal  Soft,nontender, without masses, guarding or rebound.  Liver/spleen:  No organomegaly noted  Hernia:  None appreciated  Skin  Inspection:  Grossly normal   Breasts: Examined lying and sitting.     Right: Without masses, retractions, discharge or axillary adenopathy.     Left: Without masses, retractions, discharge or axillary adenopathy. Gentitourinary   Inguinal/mons:  Normal without inguinal adenopathy  External genitalia:  Normal  BUS/Urethra/Skene's glands:  Normal  Vagina:  Normal  Cervix:  Normal  Uterus:   normal in size, shape and contour.  Midline and mobile  Adnexa/parametria:     Rt: Without masses or tenderness.   Lt: Without masses or tenderness.  Anus and perineum: Normal  Digital rectal exam: Normal sphincter tone without palpated masses or tenderness  Assessment/Plan:  50 y.o. D WF G2 P2 for annual exam with  complaint of fatigue and weight gain.   Perimenopausal/BTL STD screen per request Anxiety stable on Celexa  Plan: FSH, CBC, CMP, TSH, vitamin D, UA, Pap with HR HPV typing, new screening guidelines reviewed. GC/Chlamydia, HIV, hep B, C, RPR. Celexa 20 mg by mouth daily prescription, proper use given and reviewed. Reviewed importance of leisure, exercise, encouraged yoga. SBE's, annual screening mammogram, calcium rich diet, vitamin D 1000 daily encouraged. Menopause reviewed. Has had increased constipation, encouraged increased fiber content in diet.     Huel Cote WHNP, 3:02 PM 12/08/2015

## 2015-12-08 NOTE — Addendum Note (Signed)
Addended by: Burnett Kanaris on: 12/08/2015 03:17 PM   Modules accepted: Orders

## 2015-12-08 NOTE — Patient Instructions (Addendum)
Menopause is a normal process in which your reproductive ability comes to an end. This process happens gradually over a span of months to years, usually between the ages of 70 and 59. Menopause is complete when you have missed 12 consecutive menstrual periods. It is important to talk with your health care provider about some of the most common conditions that affect postmenopausal women, such as heart disease, cancer, and bone loss (osteoporosis). Adopting a healthy lifestyle and getting preventive care can help to promote your health and wellness. Those actions can also lower your chances of developing some of these common conditions. WHAT SHOULD I KNOW ABOUT MENOPAUSE? During menopause, you may experience a number of symptoms, such as:  Moderate-to-severe hot flashes.  Night sweats.  Decrease in sex drive.  Mood swings.  Headaches.  Tiredness.  Irritability.  Memory problems.  Insomnia. Choosing to treat or not to treat menopausal changes is an individual decision that you make with your health care provider. WHAT SHOULD I KNOW ABOUT HORMONE REPLACEMENT THERAPY AND SUPPLEMENTS? Hormone therapy products are effective for treating symptoms that are associated with menopause, such as hot flashes and night sweats. Hormone replacement carries certain risks, especially as you become older. If you are thinking about using estrogen or estrogen with progestin treatments, discuss the benefits and risks with your health care provider. WHAT SHOULD I KNOW ABOUT HEART DISEASE AND STROKE? Heart disease, heart attack, and stroke become more likely as you age. This may be due, in part, to the hormonal changes that your body experiences during menopause. These can affect how your body processes dietary fats, triglycerides, and cholesterol. Heart attack and stroke are both medical emergencies. There are many things that you can do to help prevent heart disease and stroke:  Have your blood pressure  checked at least every 1-2 years. High blood pressure causes heart disease and increases the risk of stroke.  If you are 22-67 years old, ask your health care provider if you should take aspirin to prevent a heart attack or a stroke.  Do not use any tobacco products, including cigarettes, chewing tobacco, or electronic cigarettes. If you need help quitting, ask your health care provider.  It is important to eat a healthy diet and maintain a healthy weight.  Be sure to include plenty of vegetables, fruits, low-fat dairy products, and lean protein.  Avoid eating foods that are high in solid fats, added sugars, or salt (sodium).  Get regular exercise. This is one of the most important things that you can do for your health.  Try to exercise for at least 150 minutes each week. The type of exercise that you do should increase your heart rate and make you sweat. This is known as moderate-intensity exercise.  Try to do strengthening exercises at least twice each week. Do these in addition to the moderate-intensity exercise.  Know your numbers.Ask your health care provider to check your cholesterol and your blood glucose. Continue to have your blood tested as directed by your health care provider. WHAT SHOULD I KNOW ABOUT CANCER SCREENING? There are several types of cancer. Take the following steps to reduce your risk and to catch any cancer development as early as possible. Breast Cancer  Practice breast self-awareness.  This means understanding how your breasts normally appear and feel.  It also means doing regular breast self-exams. Let your health care provider know about any changes, no matter how small.  If you are 69 or older, have a clinician do a  breast exam (clinical breast exam or CBE) every year. Depending on your age, family history, and medical history, it may be recommended that you also have a yearly breast X-ray (mammogram).  If you have a family history of breast cancer,  talk with your health care provider about genetic screening.  If you are at high risk for breast cancer, talk with your health care provider about having an MRI and a mammogram every year.  Breast cancer (BRCA) gene test is recommended for women who have family members with BRCA-related cancers. Results of the assessment will determine the need for genetic counseling and BRCA1 and for BRCA2 testing. BRCA-related cancers include these types:  Breast. This occurs in males or females.  Ovarian.  Tubal. This may also be called fallopian tube cancer.  Cancer of the abdominal or pelvic lining (peritoneal cancer).  Prostate.  Pancreatic. Cervical, Uterine, and Ovarian Cancer Your health care provider may recommend that you be screened regularly for cancer of the pelvic organs. These include your ovaries, uterus, and vagina. This screening involves a pelvic exam, which includes checking for microscopic changes to the surface of your cervix (Pap test).  For women ages 21-65, health care providers may recommend a pelvic exam and a Pap test every three years. For women ages 77-65, they may recommend the Pap test and pelvic exam, combined with testing for human papilloma virus (HPV), every five years. Some types of HPV increase your risk of cervical cancer. Testing for HPV may also be done on women of any age who have unclear Pap test results.  Other health care providers may not recommend any screening for nonpregnant women who are considered low risk for pelvic cancer and have no symptoms. Ask your health care provider if a screening pelvic exam is right for you.  If you have had past treatment for cervical cancer or a condition that could lead to cancer, you need Pap tests and screening for cancer for at least 20 years after your treatment. If Pap tests have been discontinued for you, your risk factors (such as having a new sexual partner) need to be reassessed to determine if you should start having  screenings again. Some women have medical problems that increase the chance of getting cervical cancer. In these cases, your health care provider may recommend that you have screening and Pap tests more often.  If you have a family history of uterine cancer or ovarian cancer, talk with your health care provider about genetic screening.  If you have vaginal bleeding after reaching menopause, tell your health care provider.  There are currently no reliable tests available to screen for ovarian cancer. Lung Cancer Lung cancer screening is recommended for adults 3-70 years old who are at high risk for lung cancer because of a history of smoking. A yearly low-dose CT scan of the lungs is recommended if you:  Currently smoke.  Have a history of at least 30 pack-years of smoking and you currently smoke or have quit within the past 15 years. A pack-year is smoking an average of one pack of cigarettes per day for one year. Yearly screening should:  Continue until it has been 15 years since you quit.  Stop if you develop a health problem that would prevent you from having lung cancer treatment. Colorectal Cancer  This type of cancer can be detected and can often be prevented.  Routine colorectal cancer screening usually begins at age 38 and continues through age 12.  If you have  risk factors for colon cancer, your health care provider may recommend that you be screened at an earlier age.  If you have a family history of colorectal cancer, talk with your health care provider about genetic screening.  Your health care provider may also recommend using home test kits to check for hidden blood in your stool.  A small camera at the end of a tube can be used to examine your colon directly (sigmoidoscopy or colonoscopy). This is done to check for the earliest forms of colorectal cancer.  Direct examination of the colon should be repeated every 5-10 years until age 19. However, if early forms of  precancerous polyps or small growths are found or if you have a family history or genetic risk for colorectal cancer, you may need to be screened more often. Skin Cancer  Check your skin from head to toe regularly.  Monitor any moles. Be sure to tell your health care provider:  About any new moles or changes in moles, especially if there is a change in a mole's shape or color.  If you have a mole that is larger than the size of a pencil eraser.  If any of your family members has a history of skin cancer, especially at a young age, talk with your health care provider about genetic screening.  Always use sunscreen. Apply sunscreen liberally and repeatedly throughout the day.  Whenever you are outside, protect yourself by wearing long sleeves, pants, a wide-brimmed hat, and sunglasses. WHAT SHOULD I KNOW ABOUT OSTEOPOROSIS? Osteoporosis is a condition in which bone destruction happens more quickly than new bone creation. After menopause, you may be at an increased risk for osteoporosis. To help prevent osteoporosis or the bone fractures that can happen because of osteoporosis, the following is recommended:  If you are 69-23 years old, get at least 1,000 mg of calcium and at least 600 mg of vitamin D per day.  If you are older than age 34 but younger than age 19, get at least 1,200 mg of calcium and at least 600 mg of vitamin D per day.  If you are older than age 40, get at least 1,200 mg of calcium and at least 800 mg of vitamin D per day. Smoking and excessive alcohol intake increase the risk of osteoporosis. Eat foods that are rich in calcium and vitamin D, and do weight-bearing exercises several times each week as directed by your health care provider. WHAT SHOULD I KNOW ABOUT HOW MENOPAUSE AFFECTS [place]? Depression may occur at any age, but it is more common as you become older. Common symptoms of depression include:  Low or sad mood.  Changes in sleep patterns.  Changes  in appetite or eating patterns.  Feeling an overall lack of motivation or enjoyment of activities that you previously enjoyed.  Frequent crying spells. Talk with your health care provider if you think that you are experiencing depression. WHAT SHOULD I KNOW ABOUT IMMUNIZATIONS? It is important that you get and maintain your immunizations. These include:  Tetanus, diphtheria, and pertussis (Tdap) booster vaccine.  Influenza every year before the flu season begins.  Pneumonia vaccine.  Shingles vaccine. Your health care provider may also recommend other immunizations.   This information is not intended to replace advice given to you by your health care provider. Make sure you discuss any questions you have with your health care provider.   Document Released: 09/15/2005 Document Revised: 08/14/2014 Document Reviewed: 03/26/2014 Elsevier Interactive Patient Education 2016 Elsevier  Inc. Constipation, Adult Constipation is when a person has fewer than three bowel movements a week, has difficulty having a bowel movement, or has stools that are dry, hard, or larger than normal. As people grow older, constipation is more common. A low-fiber diet, not taking in enough fluids, and taking certain medicines may make constipation worse.  CAUSES   Certain medicines, such as antidepressants, pain medicine, iron supplements, antacids, and water pills.   Certain diseases, such as diabetes, irritable bowel syndrome (IBS), thyroid disease, or depression.   Not drinking enough water.   Not eating enough fiber-rich foods.   Stress or travel.   Lack of physical activity or exercise.   Ignoring the urge to have a bowel movement.   Using laxatives too much.  SIGNS AND SYMPTOMS   Having fewer than three bowel movements a week.   Straining to have a bowel movement.   Having stools that are hard, dry, or larger than normal.   Feeling full or bloated.   Pain in the lower abdomen.    Not feeling relief after having a bowel movement.  DIAGNOSIS  Your health care provider will take a medical history and perform a physical exam. Further testing may be done for severe constipation. Some tests may include:  A barium enema X-ray to examine your rectum, colon, and, sometimes, your small intestine.   A sigmoidoscopy to examine your lower colon.   A colonoscopy to examine your entire colon. TREATMENT  Treatment will depend on the severity of your constipation and what is causing it. Some dietary treatments include drinking more fluids and eating more fiber-rich foods. Lifestyle treatments may include regular exercise. If these diet and lifestyle recommendations do not help, your health care provider may recommend taking over-the-counter laxative medicines to help you have bowel movements. Prescription medicines may be prescribed if over-the-counter medicines do not work.  HOME CARE INSTRUCTIONS   Eat foods that have a lot of fiber, such as fruits, vegetables, whole grains, and beans.  Limit foods high in fat and processed sugars, such as french fries, hamburgers, cookies, candies, and soda.   A fiber supplement may be added to your diet if you cannot get enough fiber from foods.   Drink enough fluids to keep your urine clear or pale yellow.   Exercise regularly or as directed by your health care provider.   Go to the restroom when you have the urge to go. Do not hold it.   Only take over-the-counter or prescription medicines as directed by your health care provider. Do not take other medicines for constipation without talking to your health care provider first.  [place] IF:   You have bright red blood in your stool.   Your constipation lasts for more than 4 days or gets worse.   You have abdominal or rectal pain.   You have thin, pencil-like stools.   You have unexplained weight loss. MAKE SURE YOU:   Understand these  instructions.  Will watch your condition.  Will get help right away if you are not doing well or get worse.   This information is not intended to replace advice given to you by your health care provider. Make sure you discuss any questions you have with your health care provider.   Document Released: 04/21/2004 Document Revised: 08/14/2014 Document Reviewed: 05/05/2013 Elsevier Interactive Patient Education [employer].

## 2015-12-09 LAB — RPR

## 2015-12-09 LAB — URINALYSIS W MICROSCOPIC + REFLEX CULTURE
BILIRUBIN URINE: NEGATIVE
Bacteria, UA: NONE SEEN [HPF]
Casts: NONE SEEN [LPF]
Crystals: NONE SEEN [HPF]
Glucose, UA: NEGATIVE
HGB URINE DIPSTICK: NEGATIVE
Ketones, ur: NEGATIVE
Leukocytes, UA: NEGATIVE
NITRITE: NEGATIVE
PH: 7 (ref 5.0–8.0)
Protein, ur: NEGATIVE
SPECIFIC GRAVITY, URINE: 1.025 (ref 1.001–1.035)
WBC UA: NONE SEEN WBC/HPF (ref <=5)
Yeast: NONE SEEN [HPF]

## 2015-12-09 LAB — FOLLICLE STIMULATING HORMONE: FSH: 48.8 m[IU]/mL

## 2015-12-09 LAB — VITAMIN D 25 HYDROXY (VIT D DEFICIENCY, FRACTURES): VIT D 25 HYDROXY: 34 ng/mL (ref 30–100)

## 2015-12-09 LAB — GC/CHLAMYDIA PROBE AMP
CT Probe RNA: NOT DETECTED
GC Probe RNA: NOT DETECTED

## 2015-12-09 LAB — HIV ANTIBODY (ROUTINE TESTING W REFLEX): HIV 1&2 Ab, 4th Generation: NONREACTIVE

## 2015-12-10 LAB — PAP, TP IMAGING W/ HPV RNA, RFLX HPV TYPE 16,18/45: HPV mRNA, High Risk: NOT DETECTED

## 2015-12-10 LAB — URINE CULTURE

## 2015-12-15 ENCOUNTER — Other Ambulatory Visit: Payer: Self-pay | Admitting: Gynecology

## 2015-12-15 DIAGNOSIS — R7989 Other specified abnormal findings of blood chemistry: Secondary | ICD-10-CM

## 2015-12-15 DIAGNOSIS — R799 Abnormal finding of blood chemistry, unspecified: Secondary | ICD-10-CM

## 2015-12-21 DIAGNOSIS — M531 Cervicobrachial syndrome: Secondary | ICD-10-CM | POA: Diagnosis not present

## 2015-12-21 DIAGNOSIS — M9902 Segmental and somatic dysfunction of thoracic region: Secondary | ICD-10-CM | POA: Diagnosis not present

## 2015-12-21 DIAGNOSIS — R51 Headache: Secondary | ICD-10-CM | POA: Diagnosis not present

## 2015-12-21 DIAGNOSIS — M9901 Segmental and somatic dysfunction of cervical region: Secondary | ICD-10-CM | POA: Diagnosis not present

## 2015-12-29 DIAGNOSIS — M531 Cervicobrachial syndrome: Secondary | ICD-10-CM | POA: Diagnosis not present

## 2015-12-29 DIAGNOSIS — M9902 Segmental and somatic dysfunction of thoracic region: Secondary | ICD-10-CM | POA: Diagnosis not present

## 2015-12-30 ENCOUNTER — Other Ambulatory Visit: Payer: Self-pay | Admitting: Women's Health

## 2015-12-30 DIAGNOSIS — M9902 Segmental and somatic dysfunction of thoracic region: Secondary | ICD-10-CM | POA: Diagnosis not present

## 2015-12-30 DIAGNOSIS — R51 Headache: Secondary | ICD-10-CM | POA: Diagnosis not present

## 2015-12-30 DIAGNOSIS — M531 Cervicobrachial syndrome: Secondary | ICD-10-CM | POA: Diagnosis not present

## 2015-12-30 DIAGNOSIS — M9901 Segmental and somatic dysfunction of cervical region: Secondary | ICD-10-CM | POA: Diagnosis not present

## 2015-12-31 DIAGNOSIS — M9901 Segmental and somatic dysfunction of cervical region: Secondary | ICD-10-CM | POA: Diagnosis not present

## 2015-12-31 DIAGNOSIS — R51 Headache: Secondary | ICD-10-CM | POA: Diagnosis not present

## 2015-12-31 DIAGNOSIS — M531 Cervicobrachial syndrome: Secondary | ICD-10-CM | POA: Diagnosis not present

## 2015-12-31 NOTE — Telephone Encounter (Signed)
Needs annual exam

## 2015-12-31 NOTE — Telephone Encounter (Signed)
Patient had annual exam 12/08/2015.  Rx called in.

## 2016-01-05 DIAGNOSIS — R51 Headache: Secondary | ICD-10-CM | POA: Diagnosis not present

## 2016-01-05 DIAGNOSIS — M531 Cervicobrachial syndrome: Secondary | ICD-10-CM | POA: Diagnosis not present

## 2016-01-05 DIAGNOSIS — M9901 Segmental and somatic dysfunction of cervical region: Secondary | ICD-10-CM | POA: Diagnosis not present

## 2016-01-05 DIAGNOSIS — M9902 Segmental and somatic dysfunction of thoracic region: Secondary | ICD-10-CM | POA: Diagnosis not present

## 2016-01-13 DIAGNOSIS — M9902 Segmental and somatic dysfunction of thoracic region: Secondary | ICD-10-CM | POA: Diagnosis not present

## 2016-01-13 DIAGNOSIS — M531 Cervicobrachial syndrome: Secondary | ICD-10-CM | POA: Diagnosis not present

## 2016-01-13 DIAGNOSIS — R51 Headache: Secondary | ICD-10-CM | POA: Diagnosis not present

## 2016-01-19 DIAGNOSIS — M9901 Segmental and somatic dysfunction of cervical region: Secondary | ICD-10-CM | POA: Diagnosis not present

## 2016-01-19 DIAGNOSIS — R51 Headache: Secondary | ICD-10-CM | POA: Diagnosis not present

## 2016-01-19 DIAGNOSIS — M9902 Segmental and somatic dysfunction of thoracic region: Secondary | ICD-10-CM | POA: Diagnosis not present

## 2016-01-19 DIAGNOSIS — M531 Cervicobrachial syndrome: Secondary | ICD-10-CM | POA: Diagnosis not present

## 2016-02-23 ENCOUNTER — Other Ambulatory Visit: Payer: Self-pay

## 2016-04-04 ENCOUNTER — Other Ambulatory Visit: Payer: Self-pay | Admitting: Gynecology

## 2016-04-04 DIAGNOSIS — M9902 Segmental and somatic dysfunction of thoracic region: Secondary | ICD-10-CM | POA: Diagnosis not present

## 2016-04-04 DIAGNOSIS — R51 Headache: Secondary | ICD-10-CM | POA: Diagnosis not present

## 2016-04-04 DIAGNOSIS — M9901 Segmental and somatic dysfunction of cervical region: Secondary | ICD-10-CM | POA: Diagnosis not present

## 2016-04-04 DIAGNOSIS — M531 Cervicobrachial syndrome: Secondary | ICD-10-CM | POA: Diagnosis not present

## 2016-04-04 NOTE — Telephone Encounter (Signed)
Okay for refill?  

## 2016-04-06 DIAGNOSIS — M531 Cervicobrachial syndrome: Secondary | ICD-10-CM | POA: Diagnosis not present

## 2016-04-06 DIAGNOSIS — M9901 Segmental and somatic dysfunction of cervical region: Secondary | ICD-10-CM | POA: Diagnosis not present

## 2016-04-06 DIAGNOSIS — R51 Headache: Secondary | ICD-10-CM | POA: Diagnosis not present

## 2016-04-17 DIAGNOSIS — M9902 Segmental and somatic dysfunction of thoracic region: Secondary | ICD-10-CM | POA: Diagnosis not present

## 2016-04-17 DIAGNOSIS — M9901 Segmental and somatic dysfunction of cervical region: Secondary | ICD-10-CM | POA: Diagnosis not present

## 2016-04-17 DIAGNOSIS — R51 Headache: Secondary | ICD-10-CM | POA: Diagnosis not present

## 2016-04-17 DIAGNOSIS — M531 Cervicobrachial syndrome: Secondary | ICD-10-CM | POA: Diagnosis not present

## 2016-05-03 DIAGNOSIS — M531 Cervicobrachial syndrome: Secondary | ICD-10-CM | POA: Diagnosis not present

## 2016-05-03 DIAGNOSIS — M9901 Segmental and somatic dysfunction of cervical region: Secondary | ICD-10-CM | POA: Diagnosis not present

## 2016-05-03 DIAGNOSIS — R51 Headache: Secondary | ICD-10-CM | POA: Diagnosis not present

## 2016-05-10 DIAGNOSIS — M9901 Segmental and somatic dysfunction of cervical region: Secondary | ICD-10-CM | POA: Diagnosis not present

## 2016-05-10 DIAGNOSIS — R51 Headache: Secondary | ICD-10-CM | POA: Diagnosis not present

## 2016-05-10 DIAGNOSIS — M531 Cervicobrachial syndrome: Secondary | ICD-10-CM | POA: Diagnosis not present

## 2016-05-10 DIAGNOSIS — M9902 Segmental and somatic dysfunction of thoracic region: Secondary | ICD-10-CM | POA: Diagnosis not present

## 2016-05-17 DIAGNOSIS — M531 Cervicobrachial syndrome: Secondary | ICD-10-CM | POA: Diagnosis not present

## 2016-05-17 DIAGNOSIS — M9901 Segmental and somatic dysfunction of cervical region: Secondary | ICD-10-CM | POA: Diagnosis not present

## 2016-05-17 DIAGNOSIS — M9902 Segmental and somatic dysfunction of thoracic region: Secondary | ICD-10-CM | POA: Diagnosis not present

## 2016-05-17 DIAGNOSIS — R51 Headache: Secondary | ICD-10-CM | POA: Diagnosis not present

## 2016-06-21 DIAGNOSIS — M9902 Segmental and somatic dysfunction of thoracic region: Secondary | ICD-10-CM | POA: Diagnosis not present

## 2016-06-21 DIAGNOSIS — R51 Headache: Secondary | ICD-10-CM | POA: Diagnosis not present

## 2016-06-21 DIAGNOSIS — M531 Cervicobrachial syndrome: Secondary | ICD-10-CM | POA: Diagnosis not present

## 2016-06-21 DIAGNOSIS — M9901 Segmental and somatic dysfunction of cervical region: Secondary | ICD-10-CM | POA: Diagnosis not present

## 2016-07-03 ENCOUNTER — Other Ambulatory Visit: Payer: Self-pay

## 2016-07-03 MED ORDER — ZOLPIDEM TARTRATE 10 MG PO TABS: ORAL_TABLET | ORAL | 1 refills | Status: DC

## 2016-07-03 NOTE — Telephone Encounter (Signed)
Called into pharmacy

## 2016-07-27 DIAGNOSIS — M9902 Segmental and somatic dysfunction of thoracic region: Secondary | ICD-10-CM | POA: Diagnosis not present

## 2016-07-27 DIAGNOSIS — R51 Headache: Secondary | ICD-10-CM | POA: Diagnosis not present

## 2016-07-27 DIAGNOSIS — M9901 Segmental and somatic dysfunction of cervical region: Secondary | ICD-10-CM | POA: Diagnosis not present

## 2016-07-27 DIAGNOSIS — M531 Cervicobrachial syndrome: Secondary | ICD-10-CM | POA: Diagnosis not present

## 2016-09-20 ENCOUNTER — Ambulatory Visit: Payer: BLUE CROSS/BLUE SHIELD

## 2016-10-18 DIAGNOSIS — M9902 Segmental and somatic dysfunction of thoracic region: Secondary | ICD-10-CM | POA: Diagnosis not present

## 2016-10-18 DIAGNOSIS — R51 Headache: Secondary | ICD-10-CM | POA: Diagnosis not present

## 2016-10-18 DIAGNOSIS — M9901 Segmental and somatic dysfunction of cervical region: Secondary | ICD-10-CM | POA: Diagnosis not present

## 2016-10-18 DIAGNOSIS — M531 Cervicobrachial syndrome: Secondary | ICD-10-CM | POA: Diagnosis not present

## 2016-10-24 DIAGNOSIS — R51 Headache: Secondary | ICD-10-CM | POA: Diagnosis not present

## 2016-10-24 DIAGNOSIS — M9901 Segmental and somatic dysfunction of cervical region: Secondary | ICD-10-CM | POA: Diagnosis not present

## 2016-10-24 DIAGNOSIS — M531 Cervicobrachial syndrome: Secondary | ICD-10-CM | POA: Diagnosis not present

## 2016-10-24 DIAGNOSIS — M9902 Segmental and somatic dysfunction of thoracic region: Secondary | ICD-10-CM | POA: Diagnosis not present

## 2016-12-12 DIAGNOSIS — M9901 Segmental and somatic dysfunction of cervical region: Secondary | ICD-10-CM | POA: Diagnosis not present

## 2016-12-12 DIAGNOSIS — M9902 Segmental and somatic dysfunction of thoracic region: Secondary | ICD-10-CM | POA: Diagnosis not present

## 2016-12-12 DIAGNOSIS — R51 Headache: Secondary | ICD-10-CM | POA: Diagnosis not present

## 2016-12-12 DIAGNOSIS — M531 Cervicobrachial syndrome: Secondary | ICD-10-CM | POA: Diagnosis not present

## 2016-12-13 ENCOUNTER — Ambulatory Visit (INDEPENDENT_AMBULATORY_CARE_PROVIDER_SITE_OTHER): Payer: BLUE CROSS/BLUE SHIELD | Admitting: Women's Health

## 2016-12-13 ENCOUNTER — Encounter: Payer: Self-pay | Admitting: Women's Health

## 2016-12-13 VITALS — BP 108/70 | Ht 67.5 in | Wt 143.8 lb

## 2016-12-13 DIAGNOSIS — F4322 Adjustment disorder with anxiety: Secondary | ICD-10-CM | POA: Diagnosis not present

## 2016-12-13 DIAGNOSIS — F5101 Primary insomnia: Secondary | ICD-10-CM

## 2016-12-13 DIAGNOSIS — E559 Vitamin D deficiency, unspecified: Secondary | ICD-10-CM

## 2016-12-13 DIAGNOSIS — N952 Postmenopausal atrophic vaginitis: Secondary | ICD-10-CM | POA: Diagnosis not present

## 2016-12-13 DIAGNOSIS — Z01419 Encounter for gynecological examination (general) (routine) without abnormal findings: Secondary | ICD-10-CM

## 2016-12-13 LAB — CBC WITH DIFFERENTIAL/PLATELET
BASOS ABS: 0 {cells}/uL (ref 0–200)
Basophils Relative: 0 %
EOS ABS: 235 {cells}/uL (ref 15–500)
EOS PCT: 5 %
HCT: 41.9 % (ref 35.0–45.0)
Hemoglobin: 13.7 g/dL (ref 11.7–15.5)
Lymphocytes Relative: 35 %
Lymphs Abs: 1645 cells/uL (ref 850–3900)
MCH: 27.7 pg (ref 27.0–33.0)
MCHC: 32.7 g/dL (ref 32.0–36.0)
MCV: 84.6 fL (ref 80.0–100.0)
MONOS PCT: 6 %
MPV: 9.5 fL (ref 7.5–12.5)
Monocytes Absolute: 282 cells/uL (ref 200–950)
NEUTROS ABS: 2538 {cells}/uL (ref 1500–7800)
NEUTROS PCT: 54 %
PLATELETS: 280 10*3/uL (ref 140–400)
RBC: 4.95 MIL/uL (ref 3.80–5.10)
RDW: 14.2 % (ref 11.0–15.0)
WBC: 4.7 10*3/uL (ref 3.8–10.8)

## 2016-12-13 LAB — COMPREHENSIVE METABOLIC PANEL
ALT: 20 U/L (ref 6–29)
AST: 21 U/L (ref 10–35)
Albumin: 4.4 g/dL (ref 3.6–5.1)
Alkaline Phosphatase: 78 U/L (ref 33–115)
BUN: 17 mg/dL (ref 7–25)
CHLORIDE: 102 mmol/L (ref 98–110)
CO2: 24 mmol/L (ref 20–31)
Calcium: 9.8 mg/dL (ref 8.6–10.2)
Creat: 0.93 mg/dL (ref 0.50–1.10)
Glucose, Bld: 59 mg/dL — ABNORMAL LOW (ref 65–99)
POTASSIUM: 4 mmol/L (ref 3.5–5.3)
SODIUM: 144 mmol/L (ref 135–146)
TOTAL PROTEIN: 7.4 g/dL (ref 6.1–8.1)
Total Bilirubin: 0.6 mg/dL (ref 0.2–1.2)

## 2016-12-13 LAB — TSH: TSH: 0.9 m[IU]/L

## 2016-12-13 MED ORDER — ESTRADIOL 0.1 MG/GM VA CREA: 1.0000 | TOPICAL_CREAM | Freq: Every day | VAGINAL | 12 refills | Status: DC

## 2016-12-13 MED ORDER — ZOLPIDEM TARTRATE 10 MG PO TABS: ORAL_TABLET | ORAL | 1 refills | Status: DC

## 2016-12-13 MED ORDER — CITALOPRAM HYDROBROMIDE 20 MG PO TABS: 20.0000 mg | ORAL_TABLET | Freq: Every day | ORAL | 4 refills | Status: DC

## 2016-12-13 NOTE — Progress Notes (Signed)
Jill Kennedy [birth year] [id number]    History:    Presents for annual exam.  No cycle for greater than one year, minimal menopausal symptoms. BTL. Normal Pap and mammogram history. Anxiety and depression stable on Celexa. Has 2 maternal cousins with breast cancer patient has had negative BRCA testing 10/2012. Same partner.  Past medical history, past surgical history, family history and social history were all reviewed and documented in the EPIC chart. 2 teenage sons both doing okay. Owns 2 laundromat's. Originally from [place]  ROS:  A ROS was performed and pertinent positives and negatives are included.  Exam:  Vitals:   12/13/16 1427  BP: 108/70  Weight: 143 lb 12.8 oz (65.2 kg)  Height: 5' 7.5" (1.715 m)   Body mass index is 22.19 kg/m.   General appearance:  Normal Thyroid:  Symmetrical, normal in size, without palpable masses or nodularity. Respiratory  Auscultation:  Clear without wheezing or rhonchi Cardiovascular  Auscultation:  Regular rate, without rubs, murmurs or gallops  Edema/varicosities:  Not grossly evident Abdominal  Soft,nontender, without masses, guarding or rebound.  Liver/spleen:  No organomegaly noted  Hernia:  None appreciated  Skin  Inspection:  Grossly normal   Breasts: Examined lying and sitting. Bilateral implants    Right: Without masses, retractions, discharge or axillary adenopathy.     Left: Without masses, retractions, discharge or axillary adenopathy. Gentitourinary   Inguinal/mons:  Normal without inguinal adenopathy  External genitalia:  Normal  BUS/Urethra/Skene's glands:  Normal  Vagina:  Normal  Cervix:  Normal  Uterus:   normal in size, shape and contour.  Midline and mobile  Adnexa/parametria:     Rt: Without masses or tenderness.   Lt: Without masses or tenderness.  Anus and perineum: Normal  Digital rectal exam: Normal sphincter tone without palpated masses or tenderness  Assessment/Plan:  51 y.o. DA F G3 P2 for annual  exam with complaint of vaginal dryness and weight gain.Marland Kitchen  Postmenopausal on no HRT Anxiety and depression stable on Celexa Insomnia occasional Ambien use  Plan: Vaginal atrophy and dryness reviewed will try Estrace vaginal cream 1 applicator 2-3 times weekly, externally as needed. Instructed to call if continued problems. Reviewed minimal systemic absorption. Celexa 20 mg by mouth daily prescription, proper use given and reviewed counseling as needed. Ambien 10 mg at bedtime when necessary prescription, proper use given and reviewed. SBE's, continue annual screening mammogram, calcium rich diet, vitamin D 2000 daily encouraged. CBC, TSH, CMP, vitamin D, Pap normal with negative HR HPV 2017, new screening guidelines reviewed.    [place], 5:09 PM 12/13/2016

## 2016-12-13 NOTE — Patient Instructions (Signed)
Dr Celine Ahr  [phone]   Health Maintenance for Postmenopausal Women Menopause is a normal process in which your reproductive ability comes to an end. This process happens gradually over a span of months to years, usually between the ages of 14 and 39. Menopause is complete when you have missed 12 consecutive menstrual periods. It is important to talk with your health care provider about some of the most common conditions that affect postmenopausal women, such as heart disease, cancer, and bone loss (osteoporosis). Adopting a healthy lifestyle and getting preventive care can help to promote your health and wellness. Those actions can also lower your chances of developing some of these common conditions. What should I know about menopause? During menopause, you may experience a number of symptoms, such as:  Moderate-to-severe hot flashes.  Night sweats.  Decrease in sex drive.  Mood swings.  Headaches.  Tiredness.  Irritability.  Memory problems.  Insomnia. Choosing to treat or not to treat menopausal changes is an individual decision that you make with your health care provider. What should I know about hormone replacement therapy and supplements? Hormone therapy products are effective for treating symptoms that are associated with menopause, such as hot flashes and night sweats. Hormone replacement carries certain risks, especially as you become older. If you are thinking about using estrogen or estrogen with progestin treatments, discuss the benefits and risks with your health care provider. What should I know about heart disease and stroke? Heart disease, heart attack, and stroke become more likely as you age. This may be due, in part, to the hormonal changes that your body experiences during menopause. These can affect how your body processes dietary fats, triglycerides, and cholesterol. Heart attack and stroke are both medical emergencies. There are many things that you  can do to help prevent heart disease and stroke:  Have your blood pressure checked at least every 1-2 years. High blood pressure causes heart disease and increases the risk of stroke.  If you are 89-24 years old, ask your health care provider if you should take aspirin to prevent a heart attack or a stroke.  Do not use any tobacco products, including cigarettes, chewing tobacco, or electronic cigarettes. If you need help quitting, ask your health care provider.  It is important to eat a healthy diet and maintain a healthy weight.  Be sure to include plenty of vegetables, fruits, low-fat dairy products, and lean protein.  Avoid eating foods that are high in solid fats, added sugars, or salt (sodium).  Get regular exercise. This is one of the most important things that you can do for your health.  Try to exercise for at least 150 minutes each week. The type of exercise that you do should increase your heart rate and make you sweat. This is known as moderate-intensity exercise.  Try to do strengthening exercises at least twice each week. Do these in addition to the moderate-intensity exercise.  Know your numbers.Ask your health care provider to check your cholesterol and your blood glucose. Continue to have your blood tested as directed by your health care provider. What should I know about cancer screening? There are several types of cancer. Take the following steps to reduce your risk and to catch any cancer development as early as possible. Breast Cancer  Practice breast self-awareness.  This means understanding how your breasts normally appear and feel.  It also means doing regular breast self-exams. Let your health care provider know about any changes, no matter how  small.  If you are 40 or older, have a clinician do a breast exam (clinical breast exam or CBE) every year. Depending on your age, family history, and medical history, it may be recommended that you also have a yearly  breast X-ray (mammogram).  If you have a family history of breast cancer, talk with your health care provider about genetic screening.  If you are at high risk for breast cancer, talk with your health care provider about having an MRI and a mammogram every year.  Breast cancer (BRCA) gene test is recommended for women who have family members with BRCA-related cancers. Results of the assessment will determine the need for genetic counseling and BRCA1 and for BRCA2 testing. BRCA-related cancers include these types:  Breast. This occurs in males or females.  Ovarian.  Tubal. This may also be called fallopian tube cancer.  Cancer of the abdominal or pelvic lining (peritoneal cancer).  Prostate.  Pancreatic. Cervical, Uterine, and Ovarian Cancer  Your health care provider may recommend that you be screened regularly for cancer of the pelvic organs. These include your ovaries, uterus, and vagina. This screening involves a pelvic exam, which includes checking for microscopic changes to the surface of your cervix (Pap test).  For women ages 21-65, health care providers may recommend a pelvic exam and a Pap test every three years. For women ages 95-65, they may recommend the Pap test and pelvic exam, combined with testing for human papilloma virus (HPV), every five years. Some types of HPV increase your risk of cervical cancer. Testing for HPV may also be done on women of any age who have unclear Pap test results.  Other health care providers may not recommend any screening for nonpregnant women who are considered low risk for pelvic cancer and have no symptoms. Ask your health care provider if a screening pelvic exam is right for you.  If you have had past treatment for cervical cancer or a condition that could lead to cancer, you need Pap tests and screening for cancer for at least 20 years after your treatment. If Pap tests have been discontinued for you, your risk factors (such as having a new  sexual partner) need to be reassessed to determine if you should start having screenings again. Some women have medical problems that increase the chance of getting cervical cancer. In these cases, your health care provider may recommend that you have screening and Pap tests more often.  If you have a family history of uterine cancer or ovarian cancer, talk with your health care provider about genetic screening.  If you have vaginal bleeding after reaching menopause, tell your health care provider.  There are currently no reliable tests available to screen for ovarian cancer. Lung Cancer  Lung cancer screening is recommended for adults 5-11 years old who are at high risk for lung cancer because of a history of smoking. A yearly low-dose CT scan of the lungs is recommended if you:  Currently smoke.  Have a history of at least 30 pack-years of smoking and you currently smoke or have quit within the past 15 years. A pack-year is smoking an average of one pack of cigarettes per day for one year. Yearly screening should:  Continue until it has been 15 years since you quit.  Stop if you develop a health problem that would prevent you from having lung cancer treatment. Colorectal Cancer  This type of cancer can be detected and can often be prevented.  Routine colorectal cancer  screening usually begins at age 66 and continues through age 45.  If you have risk factors for colon cancer, your health care provider may recommend that you be screened at an earlier age.  If you have a family history of colorectal cancer, talk with your health care provider about genetic screening.  Your health care provider may also recommend using home test kits to check for hidden blood in your stool.  A small camera at the end of a tube can be used to examine your colon directly (sigmoidoscopy or colonoscopy). This is done to check for the earliest forms of colorectal cancer.  Direct examination of the colon  should be repeated every 5-10 years until age 25. However, if early forms of precancerous polyps or small growths are found or if you have a family history or genetic risk for colorectal cancer, you may need to be screened more often. Skin Cancer  Check your skin from head to toe regularly.  Monitor any moles. Be sure to tell your health care provider:  About any new moles or changes in moles, especially if there is a change in a mole's shape or color.  If you have a mole that is larger than the size of a pencil eraser.  If any of your family members has a history of skin cancer, especially at a Marlynn Hinckley age, talk with your health care provider about genetic screening.  Always use sunscreen. Apply sunscreen liberally and repeatedly throughout the day.  Whenever you are outside, protect yourself by wearing long sleeves, pants, a wide-brimmed hat, and sunglasses. What should I know about osteoporosis? Osteoporosis is a condition in which bone destruction happens more quickly than new bone creation. After menopause, you may be at an increased risk for osteoporosis. To help prevent osteoporosis or the bone fractures that can happen because of osteoporosis, the following is recommended:  If you are 30-65 years old, get at least 1,000 mg of calcium and at least 600 mg of vitamin D per day.  If you are older than age 44 but younger than age 35, get at least 1,200 mg of calcium and at least 600 mg of vitamin D per day.  If you are older than age 28, get at least 1,200 mg of calcium and at least 800 mg of vitamin D per day. Smoking and excessive alcohol intake increase the risk of osteoporosis. Eat foods that are rich in calcium and vitamin D, and do weight-bearing exercises several times each week as directed by your health care provider. What should I know about how menopause affects my mental health? Depression may occur at any age, but it is more common as you become older. Common symptoms of  depression include:  Low or sad mood.  Changes in sleep patterns.  Changes in appetite or eating patterns.  Feeling an overall lack of motivation or enjoyment of activities that you previously enjoyed.  Frequent crying spells. Talk with your health care provider if you think that you are experiencing depression. What should I know about immunizations? It is important that you get and maintain your immunizations. These include:  Tetanus, diphtheria, and pertussis (Tdap) booster vaccine.  Influenza every year before the flu season begins.  Pneumonia vaccine.  Shingles vaccine. Your health care provider may also recommend other immunizations. This information is not intended to replace advice given to you by your health care provider. Make sure you discuss any questions you have with your health care provider. Document Released: 09/15/2005 Document  Revised: 02/11/2016 Document Reviewed: 04/27/2015 Elsevier Interactive Patient Education  2017 [employer].

## 2016-12-14 ENCOUNTER — Encounter: Payer: Self-pay | Admitting: Women's Health

## 2016-12-14 LAB — VITAMIN D 25 HYDROXY (VIT D DEFICIENCY, FRACTURES): VIT D 25 HYDROXY: 47 ng/mL (ref 30–100)

## 2016-12-20 ENCOUNTER — Encounter: Payer: Self-pay | Admitting: Gynecology

## 2016-12-20 DIAGNOSIS — M531 Cervicobrachial syndrome: Secondary | ICD-10-CM | POA: Diagnosis not present

## 2016-12-20 DIAGNOSIS — R51 Headache: Secondary | ICD-10-CM | POA: Diagnosis not present

## 2016-12-20 DIAGNOSIS — M9902 Segmental and somatic dysfunction of thoracic region: Secondary | ICD-10-CM | POA: Diagnosis not present

## 2016-12-20 DIAGNOSIS — M9901 Segmental and somatic dysfunction of cervical region: Secondary | ICD-10-CM | POA: Diagnosis not present

## 2017-01-01 DIAGNOSIS — L237 Allergic contact dermatitis due to plants, except food: Secondary | ICD-10-CM | POA: Diagnosis not present

## 2017-01-16 ENCOUNTER — Other Ambulatory Visit: Payer: Self-pay | Admitting: Women's Health

## 2017-01-16 DIAGNOSIS — Z1231 Encounter for screening mammogram for malignant neoplasm of breast: Secondary | ICD-10-CM

## 2017-01-31 ENCOUNTER — Ambulatory Visit: Payer: BLUE CROSS/BLUE SHIELD

## 2017-02-06 ENCOUNTER — Encounter: Payer: Self-pay | Admitting: Women's Health

## 2017-02-06 ENCOUNTER — Ambulatory Visit
Admission: RE | Admit: 2017-02-06 | Discharge: 2017-02-06 | Disposition: A | Discharge status: Home / Self Care | Payer: BLUE CROSS/BLUE SHIELD | Source: Ambulatory Visit | Attending: Women's Health | Admitting: Women's Health

## 2017-02-06 DIAGNOSIS — Z1231 Encounter for screening mammogram for malignant neoplasm of breast: Secondary | ICD-10-CM

## 2017-02-21 DIAGNOSIS — M531 Cervicobrachial syndrome: Secondary | ICD-10-CM | POA: Diagnosis not present

## 2017-02-21 DIAGNOSIS — M9901 Segmental and somatic dysfunction of cervical region: Secondary | ICD-10-CM | POA: Diagnosis not present

## 2017-02-21 DIAGNOSIS — M9902 Segmental and somatic dysfunction of thoracic region: Secondary | ICD-10-CM | POA: Diagnosis not present

## 2017-02-21 DIAGNOSIS — R51 Headache: Secondary | ICD-10-CM | POA: Diagnosis not present

## 2017-06-11 ENCOUNTER — Other Ambulatory Visit: Payer: Self-pay | Admitting: Women's Health

## 2017-06-11 DIAGNOSIS — F5101 Primary insomnia: Secondary | ICD-10-CM

## 2017-06-11 NOTE — Telephone Encounter (Signed)
Called into pharmacy

## 2017-07-04 ENCOUNTER — Ambulatory Visit: Payer: BLUE CROSS/BLUE SHIELD | Admitting: Women's Health

## 2017-07-04 VITALS — BP 112/72

## 2017-07-04 DIAGNOSIS — N938 Other specified abnormal uterine and vaginal bleeding: Secondary | ICD-10-CM

## 2017-07-04 MED ORDER — MEGESTROL ACETATE 40 MG PO TABS: 40.0000 mg | ORAL_TABLET | Freq: Two times a day (BID) | ORAL | 0 refills | Status: DC

## 2017-07-04 NOTE — Patient Instructions (Signed)
Postmenopausal Bleeding Postmenopausal bleeding is any bleeding after menopause. Menopause is when a woman's period stops. Any type of bleeding after menopause is concerning. It should be checked by your doctor. Any treatment will depend on the cause. Follow these instructions at home: Watch your condition for any changes.  Avoid the use of tampons and douches as told by your doctor.  Change your pads often.  Get regular pelvic exams and Pap tests.  Keep all appointments for tests as told by your doctor.  Contact a doctor if:  Your bleeding lasts for more than 1 week.  You have belly (abdominal) pain.  You have bleeding after sex (intercourse). Get help right away if:  You have a fever, chills, a headache, dizziness, muscle aches, and bleeding.  You have strong pain with bleeding.  You have clumps of blood (blood clots) coming from your vagina.  You have bleeding and need more than 1 pad an hour.  You feel like you are going to pass out (faint). This information is not intended to replace advice given to you by your health care provider. Make sure you discuss any questions you have with your health care provider. Document Released: 05/02/2008 Document Revised: 12/30/2015 Document Reviewed: 02/20/2013 Elsevier Interactive Patient Education  2017 [employer]  

## 2017-07-04 NOTE — Progress Notes (Signed)
51 year old DW F G3 P2 with complaint of intermittent pelvic discomfort for one month and postmenopausal bleeding 1 day, reports had bright red large amount of menses type blood last evening. Amenorrheic 2 years, on no HRT. Same partner negative STD screen. States has occasional vaginal discharge without itching, odor or irritation. Denies urinary symptoms, back pain, or fever.  Exam: Appears well, worried. External genitalia within normal limits, speculum exam moderate amount of menses type blood noted, no odor or erythema noted, cervix without visible lesion. Bimanual uterus anteverted, nontender mild tenderness in left lower quadrant.  Postmenopausal bleeding  Plan: Sonohysterogram with biopsy with Dr. Phineas Real will schedule. A prescription for Megace 40 mg twice daily for 10 days if bleeding does not stop in the next few days.

## 2017-07-05 ENCOUNTER — Other Ambulatory Visit: Payer: Self-pay | Admitting: Gynecology

## 2017-07-05 DIAGNOSIS — N939 Abnormal uterine and vaginal bleeding, unspecified: Secondary | ICD-10-CM

## 2017-07-18 ENCOUNTER — Ambulatory Visit: Payer: BLUE CROSS/BLUE SHIELD | Admitting: Gynecology

## 2017-07-18 ENCOUNTER — Encounter: Payer: Self-pay | Admitting: Gynecology

## 2017-07-18 ENCOUNTER — Ambulatory Visit (INDEPENDENT_AMBULATORY_CARE_PROVIDER_SITE_OTHER): Payer: BLUE CROSS/BLUE SHIELD

## 2017-07-18 VITALS — BP 118/78

## 2017-07-18 DIAGNOSIS — N950 Postmenopausal bleeding: Secondary | ICD-10-CM

## 2017-07-18 DIAGNOSIS — N939 Abnormal uterine and vaginal bleeding, unspecified: Secondary | ICD-10-CM | POA: Diagnosis not present

## 2017-07-18 DIAGNOSIS — N938 Other specified abnormal uterine and vaginal bleeding: Secondary | ICD-10-CM

## 2017-07-18 NOTE — Progress Notes (Signed)
    Jill Kennedy 29-Jan-1966 [id number]        51 y.o.  [id number] presents for sonohysterogram.  Was 2 years amenorrheic and then had menstrual-like flow for 5 days very end of November.  No bleeding since.  Had some cramping associated with this bleeding but otherwise without pain.  Past medical history,surgical history, problem list, medications, allergies, family history and social history were all reviewed and documented in the EPIC chart.  Directed ROS with pertinent positives and negatives documented in the history of present illness/assessment and plan.  Exam: [place] assistant Vitals:   07/18/17 1142  BP: 118/78   General appearance:  Normal Abdomen soft nontender without masses guarding rebound Pelvic external BUS vagina normal.  Cervix normal.  Uterus grossly normal size midline mobile nontender.  Adnexa without mass or tenderness.  Ultrasound transvaginal shows uterus normal size and echotexture.  Endometrial echo 3.7 mm.  Right and left ovaries normal.  Cul-de-sac negative.  Sonohysterogram performed, sterile technique, easy catheter introduction, good distention with no abnormality seen.  Endometrial sample taken.  Patient tolerated well.  Assessment/Plan:  51 y.o. [id number] with single episode of menstrual-like bleed postmenopausally.  Suspect escape ovulation.  Reviewed with patient.  At this point will keep menstrual calendar and as long as endometrial biopsy returns appropriate then will follow expectantly and she will call if she does any further bleeding.  Possibility of an inadequate endometrial biopsy given the thinness of the endometrium was reviewed which I would accept as appropriate in this clinical situation.    Anastasio Auerbach MD, 11:58 AM 07/18/2017

## 2017-07-18 NOTE — Patient Instructions (Addendum)
Office will call you with biopsy results from the ultrasound

## 2017-08-22 ENCOUNTER — Encounter: Payer: Self-pay | Admitting: Women's Health

## 2017-08-22 ENCOUNTER — Ambulatory Visit: Payer: BLUE CROSS/BLUE SHIELD | Admitting: Women's Health

## 2017-08-22 ENCOUNTER — Encounter: Payer: Self-pay | Admitting: Internal Medicine

## 2017-08-22 VITALS — BP 115/73

## 2017-08-22 DIAGNOSIS — R350 Frequency of micturition: Secondary | ICD-10-CM | POA: Diagnosis not present

## 2017-08-22 DIAGNOSIS — N898 Other specified noninflammatory disorders of vagina: Secondary | ICD-10-CM | POA: Diagnosis not present

## 2017-08-22 LAB — URINALYSIS W MICROSCOPIC + REFLEX CULTURE
Bilirubin Urine: NEGATIVE
Glucose, UA: NEGATIVE
HYALINE CAST: NONE SEEN /LPF
Hgb urine dipstick: NEGATIVE
KETONES UR: NEGATIVE
Leukocyte Esterase: NEGATIVE
Nitrites, Initial: NEGATIVE
PH: 5 (ref 5.0–8.0)
Protein, ur: NEGATIVE
RBC / HPF: NONE SEEN /HPF (ref 0–2)
SPECIFIC GRAVITY, URINE: 1.031 (ref 1.001–1.03)

## 2017-08-22 LAB — WET PREP FOR TRICH, YEAST, CLUE

## 2017-08-22 NOTE — Progress Notes (Signed)
52 year old DW F G3 P2 presents with complaint of low abdominal pressure, bloating sensation, symptoms increase by evening for the last few weeks. States discomfort is present always, nagging, rates at 3. No change or relief with eating, after urination or with bowel movements. No discomfort with lying down, pressure sensation with sitting and standing. States feels like insides are dropping/falling out. Denies urinary symptoms of frequency, pain or burning, has had increased vaginal discharge without itching or odor. Denies fever, nausea, constipation or changes in GI system. Has decreased calories desiring weight loss. No change in exercise pattern. Same partner. Postmenopausal on no HRT with no bleeding. BTL. July 04, 2017 postmenopausal bleeding had a negative sonohysterogram with biopsy and no bleeding since.   Exam: Appears well. Abdomen soft, slight discomfort with palpation at suprapubic area, no rebound or radiation of pain. External genitalia within normal limits, speculum exam scant white discharge, wet prep negative, bimanual no CMT or adnexal fullness or tenderness discomfort mostly midline at suprapubic area. Reported cramping after exam. UA: Negative leukocytes, negative blood, negative nitrites, 0-5 WBCs,  Low abdominal discomfort  Plan: Reviewed may be GI related, 51 will schedule screening colonoscopy, [facility] information given will schedule. Encouraged to avoid processed meals, has recently increased Lean cuisine intake. Reviewed normality of ultrasound several weeks ago, wet prep and UA. Instructed to call if continued problems.Marland Kitchen

## 2017-08-22 NOTE — Patient Instructions (Signed)
[facility]  Dr Carlean Purl colonoscopy   [phone]

## 2017-10-01 IMAGING — MG 2D DIGITAL SCREENING BILATERAL MAMMOGRAM WITH IMPLANTS, CAD AND
8 of 18 series · 8 of 34 positions shown · non-contrast
Comparison: Previous exam(s).

CLINICAL DATA: Screening.

EXAM:
2D DIGITAL SCREENING BILATERAL MAMMOGRAM WITH IMPLANTS, CAD AND
ADJUNCT TOMO
The patient has retropectoral implants. Standard and implant
displaced views were performed.

[R MLO (1 of 3)]
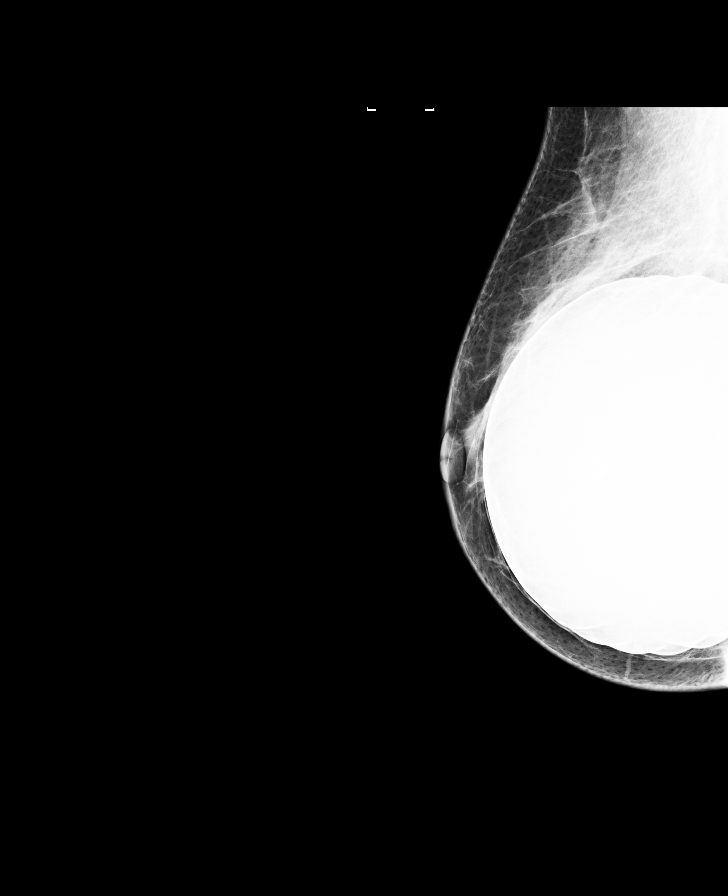

[L MLO (1 of 2)]
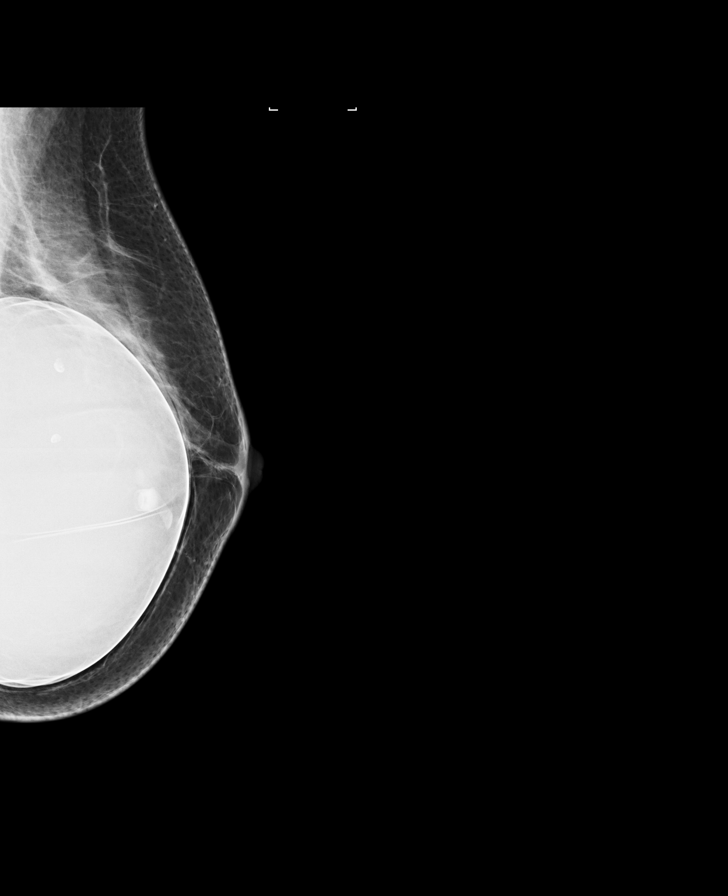

[L MLO (2 of 2)]
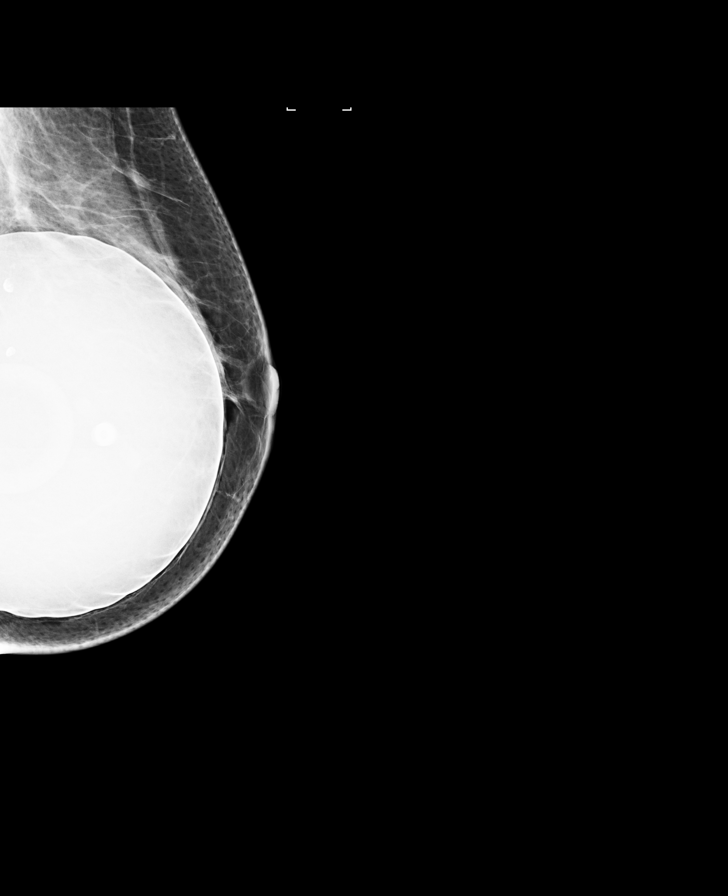

[R CC]
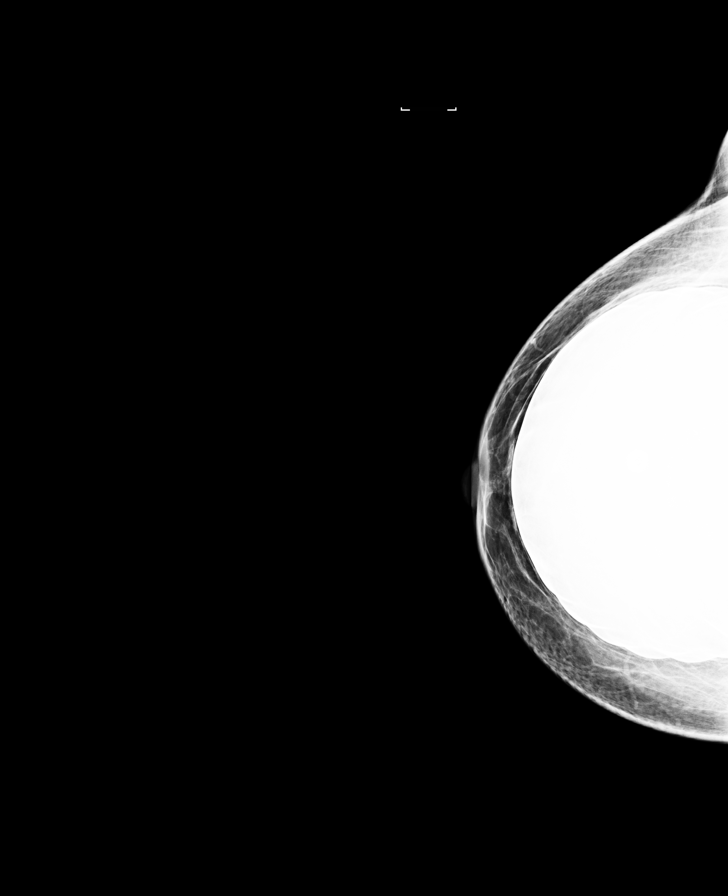

[L CC]
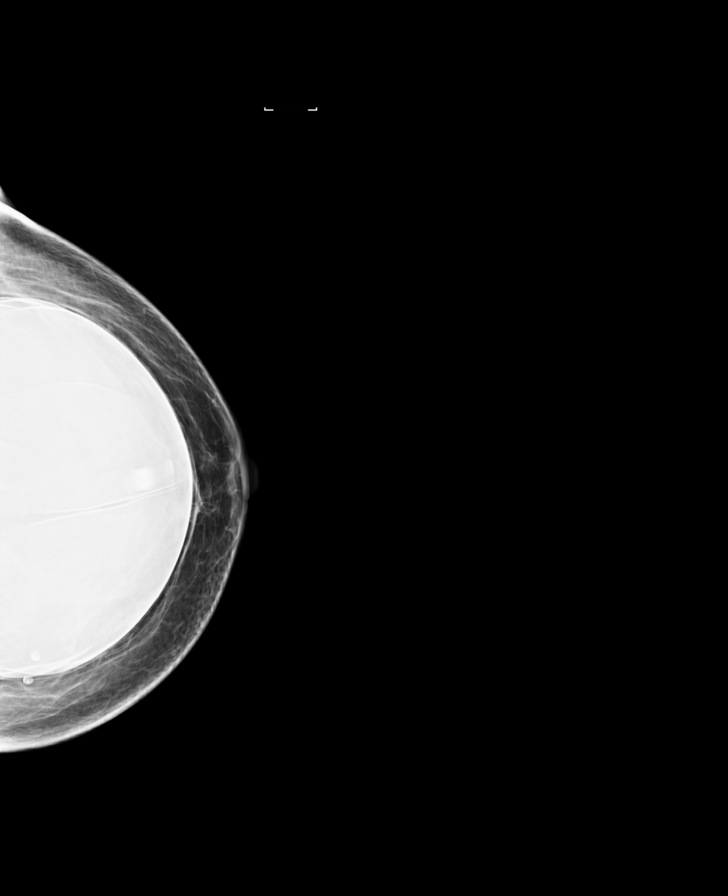

[R MLO (2 of 3)]
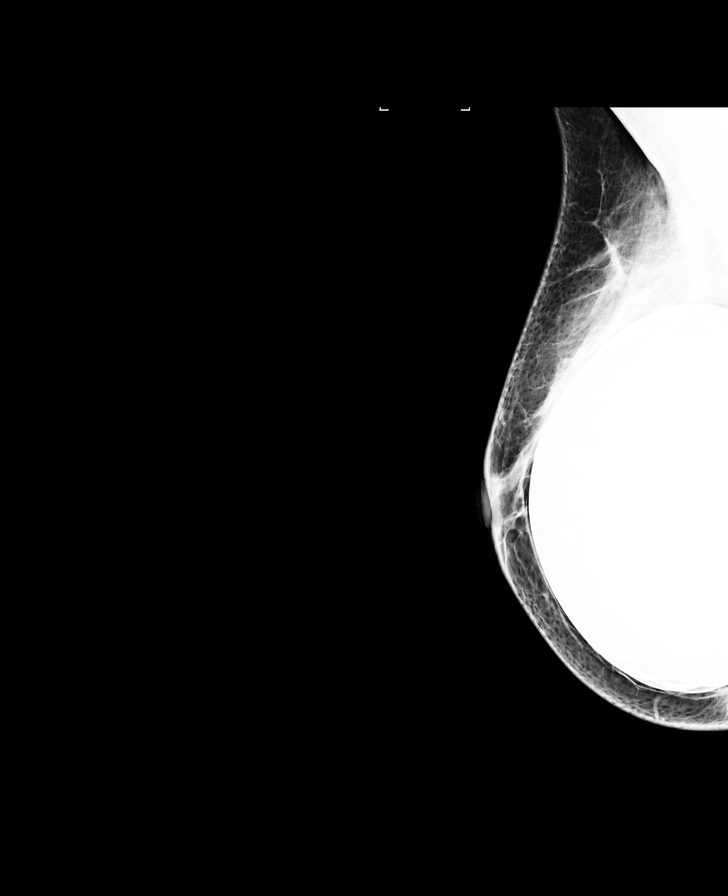

[R MLO (3 of 3)]
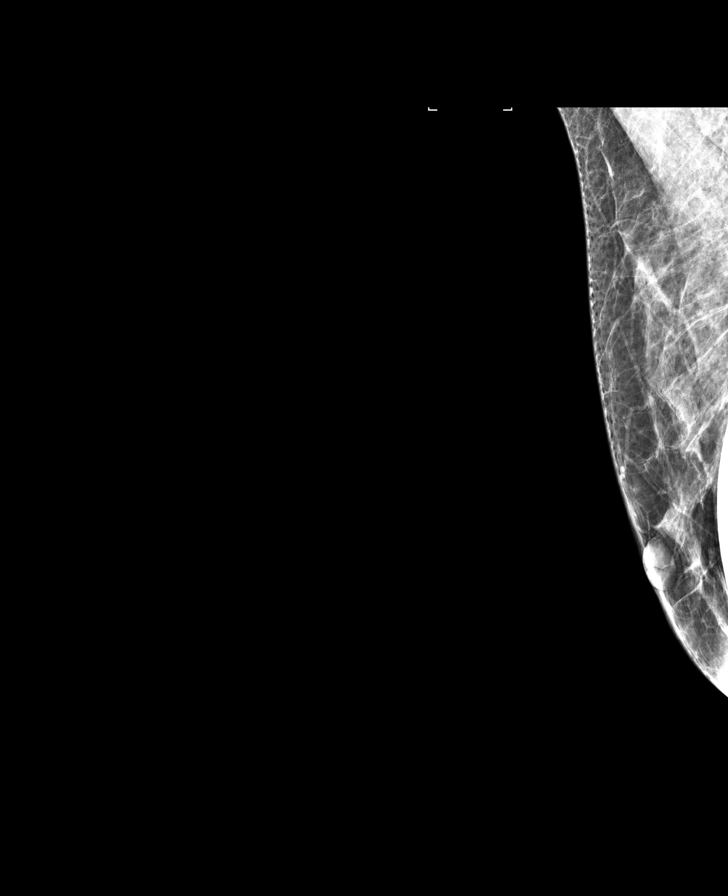

[L MLO synth-2D]
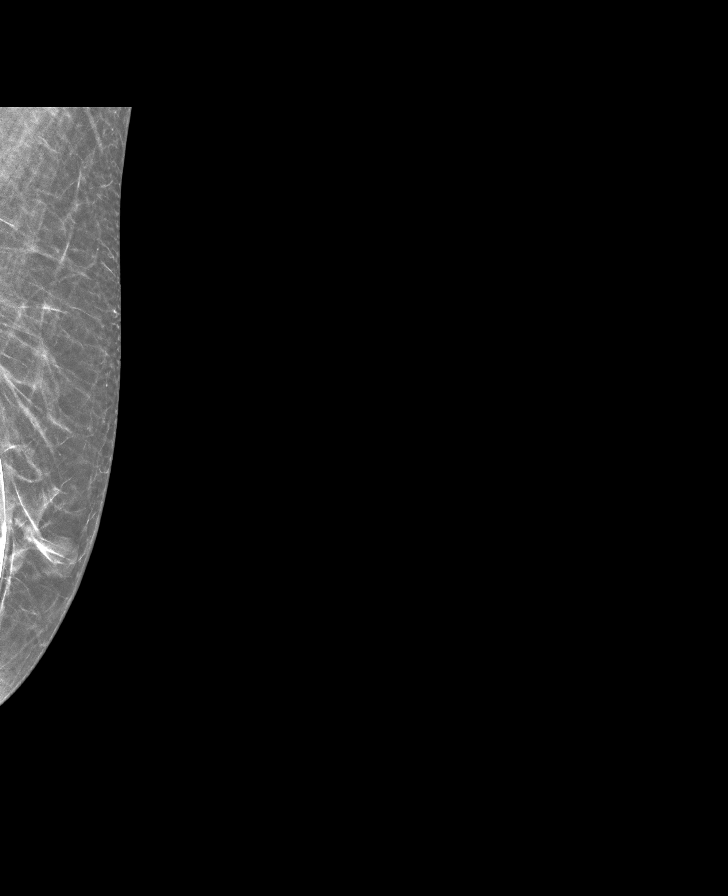

[8 of 34 positions shown; findings below may reference images not displayed]

ACR Breast Density Category b: There are scattered areas of
fibroglandular density.
FINDINGS: There are no findings suspicious for malignancy. Images were
processed with CAD.
IMPRESSION: No mammographic evidence of malignancy. A result letter of this
screening mammogram will be mailed directly to the patient.

RECOMMENDATION:
Screening mammogram in one year. (Code:H6-J-DBW)

BI-RADS CATEGORY  1:  Negative.

## 2017-10-24 ENCOUNTER — Encounter: Payer: Self-pay | Admitting: Internal Medicine

## 2017-10-24 ENCOUNTER — Ambulatory Visit (AMBULATORY_SURGERY_CENTER): Payer: Self-pay

## 2017-10-24 VITALS — Ht 67.0 in | Wt 145.2 lb

## 2017-10-24 DIAGNOSIS — Z1211 Encounter for screening for malignant neoplasm of colon: Secondary | ICD-10-CM

## 2017-10-24 NOTE — Progress Notes (Signed)
Per pt, no allergies to soy or egg products.Pt not taking any weight loss meds or using  O2 at home.  Pt refused emmi video. 

## 2017-11-07 ENCOUNTER — Encounter: Payer: Self-pay | Admitting: Internal Medicine

## 2017-11-07 ENCOUNTER — Ambulatory Visit (AMBULATORY_SURGERY_CENTER): Payer: BLUE CROSS/BLUE SHIELD | Admitting: Internal Medicine

## 2017-11-07 ENCOUNTER — Other Ambulatory Visit: Payer: Self-pay

## 2017-11-07 VITALS — BP 126/78 | HR 65 | Temp 97.3°F | Resp 13 | Ht 67.0 in | Wt 145.0 lb

## 2017-11-07 DIAGNOSIS — Z1212 Encounter for screening for malignant neoplasm of rectum: Secondary | ICD-10-CM | POA: Diagnosis not present

## 2017-11-07 DIAGNOSIS — Z1211 Encounter for screening for malignant neoplasm of colon: Secondary | ICD-10-CM | POA: Diagnosis not present

## 2017-11-07 MED ORDER — SODIUM CHLORIDE 0.9 % IV SOLN: 500.0000 mL | Freq: Once | INTRAVENOUS | Status: DC

## 2017-11-07 NOTE — Op Note (Signed)
[facility] Patient Name: Jill Kennedy Procedure Date: 11/07/2017 9:11 AM MRN: [id number] Endoscopist: Gatha Mayer , MD Age: 52 Referring MD:  Date of Birth: September 03, 1965 Gender: Female Account #: [id number] Procedure:                Colonoscopy Indications:              Screening for colorectal malignant neoplasm, This                            is the patient's first colonoscopy Procedure:                Pre-Anesthesia Assessment:                           - Prior to the procedure, a History and Physical                            was performed, and patient medications and                            allergies were reviewed. The patient's tolerance of                            previous anesthesia was also reviewed. The risks                            and benefits of the procedure and the sedation                            options and risks were discussed with the patient.                            All questions were answered, and informed consent                            was obtained. Prior Anticoagulants: The patient has                            taken no previous anticoagulant or antiplatelet                            agents. ASA Grade Assessment: II - A patient with                            mild systemic disease. After reviewing the risks                            and benefits, the patient was deemed in                            satisfactory condition to undergo the procedure.                           After obtaining informed consent, the colonoscope  was passed under direct vision. Throughout the                            procedure, the patient's blood pressure, pulse, and                            oxygen saturations were monitored continuously. The                            Model PCF-[id number]) scope was introduced                            through the anus and advanced to the the cecum,   identified by appendiceal orifice and ileocecal                            valve. The colonoscopy was performed without                            difficulty. The patient tolerated the procedure                            well. The quality of the bowel preparation was                            good. The ileocecal valve, appendiceal orifice, and                            rectum were photographed. The bowel preparation                            used was Miralax. Scope In: 9:23:58 AM Scope Out: 9:42:34 AM Scope Withdrawal Time: 0 hours 13 minutes 26 seconds  Total Procedure Duration: 0 hours 18 minutes 36 seconds  Findings:                 The entire examined colon appeared normal on direct                            and retroflexion views. Complications:            No immediate complications. Estimated Blood Loss:     Estimated blood loss: none. Impression:               - The entire examined colon is normal on direct and                            retroflexion views.                           - No specimens collected. Recommendation:           - Patient has a contact number available for                            emergencies. The signs and symptoms of potential  delayed complications were discussed with the                            patient. Return to normal activities tomorrow.                            Written discharge instructions were provided to the                            patient.                           - Resume previous diet.                           - Continue present medications.                           - Repeat screening with colonoscopy or other                            appropriate test in but not before 10 years. Gatha Mayer, MD 11/07/2017 9:48:02 AM This report has been signed electronically.

## 2017-11-07 NOTE — Patient Instructions (Addendum)
   The colonoscopy was normal!  Next routine colonoscopy or other screening test in 10 years - 2029  I appreciate the opportunity to care for you. Gatha Mayer, MD, FACG YOU HAD AN ENDOSCOPIC PROCEDURE TODAY AT [facility]:   Refer to the procedure report that was given to you for any specific questions about what was found during the examination.  If the procedure report does not answer your questions, please call your gastroenterologist to clarify.  If you requested that your care partner not be given the details of your procedure findings, then the procedure report has been included in a sealed envelope for you to review at your convenience later.  YOU SHOULD EXPECT: Some feelings of bloating in the abdomen. Passage of more gas than usual.  Walking can help get rid of the air that was put into your GI tract during the procedure and reduce the bloating. If you had a lower endoscopy (such as a colonoscopy or flexible sigmoidoscopy) you may notice spotting of blood in your stool or on the toilet paper. If you underwent a bowel prep for your procedure, you may not have a normal bowel movement for a few days.  Please Note:  You might notice some irritation and congestion in your nose or some drainage.  This is from the oxygen used during your procedure.  There is no need for concern and it should clear up in a day or so.  SYMPTOMS TO REPORT IMMEDIATELY:   Following lower endoscopy (colonoscopy or flexible sigmoidoscopy):  Excessive amounts of blood in the stool  Significant tenderness or worsening of abdominal pains  Swelling of the abdomen that is new, acute  Fever of 100F or higher  For urgent or emergent issues, a gastroenterologist can be reached at any hour by calling [phone].   DIET:  We do recommend a small meal at first, but then you may proceed to your regular diet.  Drink plenty of fluids but you should avoid alcoholic beverages for 24  hours.  ACTIVITY:  You should plan to take it easy for the rest of today and you should NOT DRIVE or use heavy machinery until tomorrow (because of the sedation medicines used during the test).    FOLLOW UP: Our staff will call the number listed on your records the next business day following your procedure to check on you and address any questions or concerns that you may have regarding the information given to you following your procedure. If we do not reach you, we will leave a message.  However, if you are feeling well and you are not experiencing any problems, there is no need to return our call.  We will assume that you have returned to your regular daily activities without incident.  If any biopsies were taken you will be contacted by phone or by letter within the next 1-3 weeks.  Please call us at [phone] if you have not heard about the biopsies in 3 weeks.   Repeat next Colonoscopy screening in 10 years  SIGNATURES/CONFIDENTIALITY: You and/or your care partner have signed paperwork which will be entered into your electronic medical record.  These signatures attest to the fact that that the information above on your After Visit Summary has been reviewed and is understood.  Full responsibility of the confidentiality of this discharge information lies with you and/or your care-partner.

## 2017-11-07 NOTE — Progress Notes (Signed)
Pt's states no medical or surgical changes since previsit or office visit. 

## 2017-11-07 NOTE — Progress Notes (Signed)
To recovery, report to RN, VSS. 

## 2017-11-08 ENCOUNTER — Telehealth: Payer: Self-pay

## 2017-11-08 NOTE — Telephone Encounter (Signed)
NO ANSWER, MESSAGE LEFT FOR PATIENT. 

## 2017-11-08 NOTE — Telephone Encounter (Signed)
  Follow up Call-  Call back number 11/07/2017  Post procedure Call Back phone  # [phone]  Permission to leave phone message Yes  Some recent data might be hidden     Patient questions:  Do you have a fever, pain , or abdominal swelling? No. Pain Score  0 *  Have you tolerated food without any problems? Yes.    Have you been able to return to your normal activities? Yes.    Do you have any questions about your discharge instructions: Diet   No. Medications  No. Follow up visit  No.  Do you have questions or concerns about your Care? No.  Actions: * If pain score is 4 or above: No action needed, pain <4.

## 2017-11-22 DIAGNOSIS — R51 Headache: Secondary | ICD-10-CM | POA: Diagnosis not present

## 2017-11-22 DIAGNOSIS — M9901 Segmental and somatic dysfunction of cervical region: Secondary | ICD-10-CM | POA: Diagnosis not present

## 2017-11-22 DIAGNOSIS — M531 Cervicobrachial syndrome: Secondary | ICD-10-CM | POA: Diagnosis not present

## 2017-11-22 DIAGNOSIS — M9902 Segmental and somatic dysfunction of thoracic region: Secondary | ICD-10-CM | POA: Diagnosis not present

## 2017-12-05 DIAGNOSIS — M9902 Segmental and somatic dysfunction of thoracic region: Secondary | ICD-10-CM | POA: Diagnosis not present

## 2017-12-05 DIAGNOSIS — M9901 Segmental and somatic dysfunction of cervical region: Secondary | ICD-10-CM | POA: Diagnosis not present

## 2017-12-05 DIAGNOSIS — M531 Cervicobrachial syndrome: Secondary | ICD-10-CM | POA: Diagnosis not present

## 2017-12-05 DIAGNOSIS — R51 Headache: Secondary | ICD-10-CM | POA: Diagnosis not present

## 2017-12-10 DIAGNOSIS — M9901 Segmental and somatic dysfunction of cervical region: Secondary | ICD-10-CM | POA: Diagnosis not present

## 2017-12-10 DIAGNOSIS — R51 Headache: Secondary | ICD-10-CM | POA: Diagnosis not present

## 2017-12-10 DIAGNOSIS — M9902 Segmental and somatic dysfunction of thoracic region: Secondary | ICD-10-CM | POA: Diagnosis not present

## 2017-12-10 DIAGNOSIS — M531 Cervicobrachial syndrome: Secondary | ICD-10-CM | POA: Diagnosis not present

## 2017-12-12 DIAGNOSIS — M9902 Segmental and somatic dysfunction of thoracic region: Secondary | ICD-10-CM | POA: Diagnosis not present

## 2017-12-12 DIAGNOSIS — M9901 Segmental and somatic dysfunction of cervical region: Secondary | ICD-10-CM | POA: Diagnosis not present

## 2017-12-12 DIAGNOSIS — M531 Cervicobrachial syndrome: Secondary | ICD-10-CM | POA: Diagnosis not present

## 2017-12-12 DIAGNOSIS — R51 Headache: Secondary | ICD-10-CM | POA: Diagnosis not present

## 2017-12-17 DIAGNOSIS — M9901 Segmental and somatic dysfunction of cervical region: Secondary | ICD-10-CM | POA: Diagnosis not present

## 2017-12-17 DIAGNOSIS — R51 Headache: Secondary | ICD-10-CM | POA: Diagnosis not present

## 2017-12-17 DIAGNOSIS — M9902 Segmental and somatic dysfunction of thoracic region: Secondary | ICD-10-CM | POA: Diagnosis not present

## 2017-12-17 DIAGNOSIS — M531 Cervicobrachial syndrome: Secondary | ICD-10-CM | POA: Diagnosis not present

## 2017-12-19 ENCOUNTER — Encounter: Payer: BLUE CROSS/BLUE SHIELD | Admitting: Women's Health

## 2017-12-19 DIAGNOSIS — M9902 Segmental and somatic dysfunction of thoracic region: Secondary | ICD-10-CM | POA: Diagnosis not present

## 2017-12-19 DIAGNOSIS — M531 Cervicobrachial syndrome: Secondary | ICD-10-CM | POA: Diagnosis not present

## 2017-12-19 DIAGNOSIS — R51 Headache: Secondary | ICD-10-CM | POA: Diagnosis not present

## 2017-12-19 DIAGNOSIS — M9901 Segmental and somatic dysfunction of cervical region: Secondary | ICD-10-CM | POA: Diagnosis not present

## 2017-12-26 DIAGNOSIS — R51 Headache: Secondary | ICD-10-CM | POA: Diagnosis not present

## 2017-12-26 DIAGNOSIS — M9902 Segmental and somatic dysfunction of thoracic region: Secondary | ICD-10-CM | POA: Diagnosis not present

## 2017-12-26 DIAGNOSIS — M531 Cervicobrachial syndrome: Secondary | ICD-10-CM | POA: Diagnosis not present

## 2017-12-26 DIAGNOSIS — M9901 Segmental and somatic dysfunction of cervical region: Secondary | ICD-10-CM | POA: Diagnosis not present

## 2018-01-02 DIAGNOSIS — M531 Cervicobrachial syndrome: Secondary | ICD-10-CM | POA: Diagnosis not present

## 2018-01-02 DIAGNOSIS — M9902 Segmental and somatic dysfunction of thoracic region: Secondary | ICD-10-CM | POA: Diagnosis not present

## 2018-01-02 DIAGNOSIS — M9901 Segmental and somatic dysfunction of cervical region: Secondary | ICD-10-CM | POA: Diagnosis not present

## 2018-01-02 DIAGNOSIS — R51 Headache: Secondary | ICD-10-CM | POA: Diagnosis not present

## 2018-01-08 DIAGNOSIS — R51 Headache: Secondary | ICD-10-CM | POA: Diagnosis not present

## 2018-01-08 DIAGNOSIS — M9901 Segmental and somatic dysfunction of cervical region: Secondary | ICD-10-CM | POA: Diagnosis not present

## 2018-01-08 DIAGNOSIS — M9902 Segmental and somatic dysfunction of thoracic region: Secondary | ICD-10-CM | POA: Diagnosis not present

## 2018-01-08 DIAGNOSIS — M531 Cervicobrachial syndrome: Secondary | ICD-10-CM | POA: Diagnosis not present

## 2018-01-09 ENCOUNTER — Other Ambulatory Visit: Payer: Self-pay | Admitting: Women's Health

## 2018-01-09 DIAGNOSIS — Z1231 Encounter for screening mammogram for malignant neoplasm of breast: Secondary | ICD-10-CM

## 2018-01-10 DIAGNOSIS — M531 Cervicobrachial syndrome: Secondary | ICD-10-CM | POA: Diagnosis not present

## 2018-01-10 DIAGNOSIS — M9902 Segmental and somatic dysfunction of thoracic region: Secondary | ICD-10-CM | POA: Diagnosis not present

## 2018-01-10 DIAGNOSIS — M9901 Segmental and somatic dysfunction of cervical region: Secondary | ICD-10-CM | POA: Diagnosis not present

## 2018-01-10 DIAGNOSIS — R51 Headache: Secondary | ICD-10-CM | POA: Diagnosis not present

## 2018-01-16 DIAGNOSIS — M9901 Segmental and somatic dysfunction of cervical region: Secondary | ICD-10-CM | POA: Diagnosis not present

## 2018-01-16 DIAGNOSIS — M9902 Segmental and somatic dysfunction of thoracic region: Secondary | ICD-10-CM | POA: Diagnosis not present

## 2018-01-16 DIAGNOSIS — M531 Cervicobrachial syndrome: Secondary | ICD-10-CM | POA: Diagnosis not present

## 2018-01-16 DIAGNOSIS — R51 Headache: Secondary | ICD-10-CM | POA: Diagnosis not present

## 2018-01-30 DIAGNOSIS — M9902 Segmental and somatic dysfunction of thoracic region: Secondary | ICD-10-CM | POA: Diagnosis not present

## 2018-01-30 DIAGNOSIS — M531 Cervicobrachial syndrome: Secondary | ICD-10-CM | POA: Diagnosis not present

## 2018-01-30 DIAGNOSIS — M9901 Segmental and somatic dysfunction of cervical region: Secondary | ICD-10-CM | POA: Diagnosis not present

## 2018-02-12 ENCOUNTER — Ambulatory Visit
Admission: RE | Admit: 2018-02-12 | Discharge: 2018-02-12 | Disposition: A | Discharge status: Home / Self Care | Payer: BLUE CROSS/BLUE SHIELD | Source: Ambulatory Visit | Attending: Women's Health | Admitting: Women's Health

## 2018-02-12 DIAGNOSIS — Z1231 Encounter for screening mammogram for malignant neoplasm of breast: Secondary | ICD-10-CM

## 2018-02-13 ENCOUNTER — Encounter (INDEPENDENT_AMBULATORY_CARE_PROVIDER_SITE_OTHER): Payer: Self-pay

## 2018-02-14 DIAGNOSIS — M531 Cervicobrachial syndrome: Secondary | ICD-10-CM | POA: Diagnosis not present

## 2018-02-14 DIAGNOSIS — M9902 Segmental and somatic dysfunction of thoracic region: Secondary | ICD-10-CM | POA: Diagnosis not present

## 2018-02-14 DIAGNOSIS — M9901 Segmental and somatic dysfunction of cervical region: Secondary | ICD-10-CM | POA: Diagnosis not present

## 2018-02-19 ENCOUNTER — Ambulatory Visit: Payer: BLUE CROSS/BLUE SHIELD | Admitting: Women's Health

## 2018-02-19 ENCOUNTER — Encounter: Payer: Self-pay | Admitting: Women's Health

## 2018-02-19 VITALS — BP 110/70 | Ht 67.0 in | Wt 145.0 lb

## 2018-02-19 DIAGNOSIS — F4322 Adjustment disorder with anxiety: Secondary | ICD-10-CM | POA: Diagnosis not present

## 2018-02-19 DIAGNOSIS — Z13220 Encounter for screening for lipoid disorders: Secondary | ICD-10-CM

## 2018-02-19 DIAGNOSIS — F5101 Primary insomnia: Secondary | ICD-10-CM | POA: Diagnosis not present

## 2018-02-19 DIAGNOSIS — Z01419 Encounter for gynecological examination (general) (routine) without abnormal findings: Secondary | ICD-10-CM | POA: Diagnosis not present

## 2018-02-19 LAB — COMPREHENSIVE METABOLIC PANEL
AG Ratio: 1.5 (calc) (ref 1.0–2.5)
ALT: 21 U/L (ref 6–29)
AST: 21 U/L (ref 10–35)
Albumin: 4.7 g/dL (ref 3.6–5.1)
Alkaline phosphatase (APISO): 79 U/L (ref 33–130)
BUN: 18 mg/dL (ref 7–25)
CO2: 30 mmol/L (ref 20–32)
Calcium: 10.8 mg/dL — ABNORMAL HIGH (ref 8.6–10.4)
Chloride: 101 mmol/L (ref 98–110)
Creat: 0.9 mg/dL (ref 0.50–1.05)
GLUCOSE: 100 mg/dL — AB (ref 65–99)
Globulin: 3.1 g/dL (calc) (ref 1.9–3.7)
Potassium: 4.3 mmol/L (ref 3.5–5.3)
SODIUM: 140 mmol/L (ref 135–146)
TOTAL PROTEIN: 7.8 g/dL (ref 6.1–8.1)
Total Bilirubin: 0.8 mg/dL (ref 0.2–1.2)

## 2018-02-19 LAB — LIPID PANEL
Cholesterol: 243 mg/dL — ABNORMAL HIGH (ref <200)
HDL: 74 mg/dL (ref >50)
LDL CHOLESTEROL (CALC): 144 mg/dL — AB
NON-HDL CHOLESTEROL (CALC): 169 mg/dL — AB (ref <130)
Total CHOL/HDL Ratio: 3.3 (calc) (ref <5.0)
Triglycerides: 125 mg/dL (ref <150)

## 2018-02-19 LAB — CBC WITH DIFFERENTIAL/PLATELET
BASOS ABS: 41 {cells}/uL (ref 0–200)
Basophils Relative: 0.8 %
EOS ABS: 230 {cells}/uL (ref 15–500)
EOS PCT: 4.5 %
HCT: 42 % (ref 35.0–45.0)
HEMOGLOBIN: 14.1 g/dL (ref 11.7–15.5)
Lymphs Abs: 1964 cells/uL (ref 850–3900)
MCH: 28.4 pg (ref 27.0–33.0)
MCHC: 33.6 g/dL (ref 32.0–36.0)
MCV: 84.5 fL (ref 80.0–100.0)
MONOS PCT: 6.5 %
MPV: 9.6 fL (ref 7.5–12.5)
NEUTROS ABS: 2535 {cells}/uL (ref 1500–7800)
Neutrophils Relative %: 49.7 %
PLATELETS: 292 10*3/uL (ref 140–400)
RBC: 4.97 10*6/uL (ref 3.80–5.10)
RDW: 13.1 % (ref 11.0–15.0)
TOTAL LYMPHOCYTE: 38.5 %
WBC mixed population: 332 cells/uL (ref 200–950)
WBC: 5.1 10*3/uL (ref 3.8–10.8)

## 2018-02-19 MED ORDER — ZOLPIDEM TARTRATE 10 MG PO TABS: ORAL_TABLET | ORAL | 1 refills | Status: DC

## 2018-02-19 MED ORDER — CITALOPRAM HYDROBROMIDE 20 MG PO TABS: 20.0000 mg | ORAL_TABLET | Freq: Every day | ORAL | 4 refills | Status: DC

## 2018-02-19 NOTE — Patient Instructions (Signed)
Health Maintenance for Postmenopausal Women Menopause is a normal process in which your reproductive ability comes to an end. This process happens gradually over a span of months to years, usually between the ages of 22 and 9. Menopause is complete when you have missed 12 consecutive menstrual periods. It is important to talk with your health care provider about some of the most common conditions that affect postmenopausal women, such as heart disease, cancer, and bone loss (osteoporosis). Adopting a healthy lifestyle and getting preventive care can help to promote your health and wellness. Those actions can also lower your chances of developing some of these common conditions. What should I know about menopause? During menopause, you may experience a number of symptoms, such as:  Moderate-to-severe hot flashes.  Night sweats.  Decrease in sex drive.  Mood swings.  Headaches.  Tiredness.  Irritability.  Memory problems.  Insomnia.  Choosing to treat or not to treat menopausal changes is an individual decision that you make with your health care provider. What should I know about hormone replacement therapy and supplements? Hormone therapy products are effective for treating symptoms that are associated with menopause, such as hot flashes and night sweats. Hormone replacement carries certain risks, especially as you become older. If you are thinking about using estrogen or estrogen with progestin treatments, discuss the benefits and risks with your health care provider. What should I know about heart disease and stroke? Heart disease, heart attack, and stroke become more likely as you age. This may be due, in part, to the hormonal changes that your body experiences during menopause. These can affect how your body processes dietary fats, triglycerides, and cholesterol. Heart attack and stroke are both medical emergencies. There are many things that you can do to help prevent heart disease  and stroke:  Have your blood pressure checked at least every 1-2 years. High blood pressure causes heart disease and increases the risk of stroke.  If you are 53-22 years old, ask your health care provider if you should take aspirin to prevent a heart attack or a stroke.  Do not use any tobacco products, including cigarettes, chewing tobacco, or electronic cigarettes. If you need help quitting, ask your health care provider.  It is important to eat a healthy diet and maintain a healthy weight. ? Be sure to include plenty of vegetables, fruits, low-fat dairy products, and lean protein. ? Avoid eating foods that are high in solid fats, added sugars, or salt (sodium).  Get regular exercise. This is one of the most important things that you can do for your health. ? Try to exercise for at least 150 minutes each week. The type of exercise that you do should increase your heart rate and make you sweat. This is known as moderate-intensity exercise. ? Try to do strengthening exercises at least twice each week. Do these in addition to the moderate-intensity exercise.  Know your numbers.Ask your health care provider to check your cholesterol and your blood glucose. Continue to have your blood tested as directed by your health care provider.  What should I know about cancer screening? There are several types of cancer. Take the following steps to reduce your risk and to catch any cancer development as early as possible. Breast Cancer  Practice breast self-awareness. ? This means understanding how your breasts normally appear and feel. ? It also means doing regular breast self-exams. Let your health care provider know about any changes, no matter how small.  If you are 40  or older, have a clinician do a breast exam (clinical breast exam or CBE) every year. Depending on your age, family history, and medical history, it may be recommended that you also have a yearly breast X-ray (mammogram).  If you  have a family history of breast cancer, talk with your health care provider about genetic screening.  If you are at high risk for breast cancer, talk with your health care provider about having an MRI and a mammogram every year.  Breast cancer (BRCA) gene test is recommended for women who have family members with BRCA-related cancers. Results of the assessment will determine the need for genetic counseling and BRCA1 and for BRCA2 testing. BRCA-related cancers include these types: ? Breast. This occurs in males or females. ? Ovarian. ? Tubal. This may also be called fallopian tube cancer. ? Cancer of the abdominal or pelvic lining (peritoneal cancer). ? Prostate. ? Pancreatic.  Cervical, Uterine, and Ovarian Cancer Your health care provider may recommend that you be screened regularly for cancer of the pelvic organs. These include your ovaries, uterus, and vagina. This screening involves a pelvic exam, which includes checking for microscopic changes to the surface of your cervix (Pap test).  For women ages 21-65, health care providers may recommend a pelvic exam and a Pap test every three years. For women ages 79-65, they may recommend the Pap test and pelvic exam, combined with testing for human papilloma virus (HPV), every five years. Some types of HPV increase your risk of cervical cancer. Testing for HPV may also be done on women of any age who have unclear Pap test results.  Other health care providers may not recommend any screening for nonpregnant women who are considered low risk for pelvic cancer and have no symptoms. Ask your health care provider if a screening pelvic exam is right for you.  If you have had past treatment for cervical cancer or a condition that could lead to cancer, you need Pap tests and screening for cancer for at least 20 years after your treatment. If Pap tests have been discontinued for you, your risk factors (such as having a new sexual partner) need to be  reassessed to determine if you should start having screenings again. Some women have medical problems that increase the chance of getting cervical cancer. In these cases, your health care provider may recommend that you have screening and Pap tests more often.  If you have a family history of uterine cancer or ovarian cancer, talk with your health care provider about genetic screening.  If you have vaginal bleeding after reaching menopause, tell your health care provider.  There are currently no reliable tests available to screen for ovarian cancer.  Lung Cancer Lung cancer screening is recommended for adults 69-62 years old who are at high risk for lung cancer because of a history of smoking. A yearly low-dose CT scan of the lungs is recommended if you:  Currently smoke.  Have a history of at least 30 pack-years of smoking and you currently smoke or have quit within the past 15 years. A pack-year is smoking an average of one pack of cigarettes per day for one year.  Yearly screening should:  Continue until it has been 15 years since you quit.  Stop if you develop a health problem that would prevent you from having lung cancer treatment.  Colorectal Cancer  This type of cancer can be detected and can often be prevented.  Routine colorectal cancer screening usually begins at  age 42 and continues through age 45.  If you have risk factors for colon cancer, your health care provider may recommend that you be screened at an earlier age.  If you have a family history of colorectal cancer, talk with your health care provider about genetic screening.  Your health care provider may also recommend using home test kits to check for hidden blood in your stool.  A small camera at the end of a tube can be used to examine your colon directly (sigmoidoscopy or colonoscopy). This is done to check for the earliest forms of colorectal cancer.  Direct examination of the colon should be repeated every  5-10 years until age 71. However, if early forms of precancerous polyps or small growths are found or if you have a family history or genetic risk for colorectal cancer, you may need to be screened more often.  Skin Cancer  Check your skin from head to toe regularly.  Monitor any moles. Be sure to tell your health care provider: ? About any new moles or changes in moles, especially if there is a change in a mole's shape or color. ? If you have a mole that is larger than the size of a pencil eraser.  If any of your family members has a history of skin cancer, especially at a young age, talk with your health care provider about genetic screening.  Always use sunscreen. Apply sunscreen liberally and repeatedly throughout the day.  Whenever you are outside, protect yourself by wearing long sleeves, pants, a wide-brimmed hat, and sunglasses.  What should I know about osteoporosis? Osteoporosis is a condition in which bone destruction happens more quickly than new bone creation. After menopause, you may be at an increased risk for osteoporosis. To help prevent osteoporosis or the bone fractures that can happen because of osteoporosis, the following is recommended:  If you are 46-71 years old, get at least 1,000 mg of calcium and at least 600 mg of vitamin D per day.  If you are older than age 55 but younger than age 65, get at least 1,200 mg of calcium and at least 600 mg of vitamin D per day.  If you are older than age 54, get at least 1,200 mg of calcium and at least 800 mg of vitamin D per day.  Smoking and excessive alcohol intake increase the risk of osteoporosis. Eat foods that are rich in calcium and vitamin D, and do weight-bearing exercises several times each week as directed by your health care provider. What should I know about how menopause affects my mental health? Depression may occur at any age, but it is more common as you become older. Common symptoms of depression  include:  Low or sad mood.  Changes in sleep patterns.  Changes in appetite or eating patterns.  Feeling an overall lack of motivation or enjoyment of activities that you previously enjoyed.  Frequent crying spells.  Talk with your health care provider if you think that you are experiencing depression. What should I know about immunizations? It is important that you get and maintain your immunizations. These include:  Tetanus, diphtheria, and pertussis (Tdap) booster vaccine.  Influenza every year before the flu season begins.  Pneumonia vaccine.  Shingles vaccine.  Your health care provider may also recommend other immunizations. This information is not intended to replace advice given to you by your health care provider. Make sure you discuss any questions you have with your health care provider. Document Released: 09/15/2005  Document Revised: 02/11/2016 Document Reviewed: 04/27/2015 Elsevier Interactive Patient Education  2018 [employer]  

## 2018-02-19 NOTE — Progress Notes (Signed)
Jill Kennedy 20-Apr-1966 [id number]    History:    Presents for annual exam.  Postmenopausal on no HRT with no bleeding.  Normal Pap and mammogram history.  History of BTL.  2012/10/17- BRCA, mother deceased from breast cancer.  Anxiety and depression had been stable on Celexa did stop it several months ago and has had increased moodiness, anger, and generally not feeling as well.  11/2017- colonoscopy.  Same partner.  Past medical history, past surgical history, family history and social history were all reviewed and documented in the EPIC chart.  Owns several laundromats.  2 sons oldest causing some issues. Father diabetes.  ROS:  A ROS was performed and pertinent positives and negatives are included.  Exam:  Vitals:   02/19/18 1442  BP: 110/70  Weight: 145 lb (65.8 kg)  Height: '5\' 7"'  (1.702 m)   Body mass index is 22.71 kg/m.   General appearance:  Normal Thyroid:  Symmetrical, normal in size, without palpable masses or nodularity. Respiratory  Auscultation:  Clear without wheezing or rhonchi Cardiovascular  Auscultation:  Regular rate, without rubs, murmurs or gallops  Edema/varicosities:  Not grossly evident Abdominal  Soft,nontender, without masses, guarding or rebound.  Liver/spleen:  No organomegaly noted  Hernia:  None appreciated  Skin  Inspection:  Grossly normal   Breasts: Examined lying and sitting. Bilateral implants    Right: Without masses, retractions, discharge or axillary adenopathy.     Left: Without masses, retractions, discharge or axillary adenopathy. Gentitourinary   Inguinal/mons:  Normal without inguinal adenopathy  External genitalia:  Normal  BUS/Urethra/Skene's glands:  Normal  Vagina:  Normal  Cervix:  Normal  Uterus:   normal in size, shape and contour.  Midline and mobile  Adnexa/parametria:     Rt: Without masses or tenderness.   Lt: Without masses or tenderness.  Anus and perineum: Normal  Digital rectal exam: Normal sphincter tone without  palpated masses or tenderness  Assessment/Plan:  52 y.o. D WF G3, P2 for annual exam.   Menopausal/no HRT/no bleeding Anxiety/depression  Plan: Options reviewed, Celexa 20 mg p.o. daily  will start back with half tablet daily, prescription, proper use given and reviewed need for leisure, exercise,  self-care.  Instructed to call if no relief or continued problems with mood.  Strongly encouraged to return to counseling due to issues with oldest son.  SBE's, annual screening mammogram, calcium rich foods, vitamin D 2000 daily encouraged.  Ambien 10 mg p.o. at bedtime as needed, aware to use sparingly, addictive properties reviewed.    Pap normal 2015/10/18, new screening guidelines reviewed.  CBC, CMP , lipid panel,   Huel Cote [facility], 6:26 PM 02/19/2018

## 2018-03-06 DIAGNOSIS — M9901 Segmental and somatic dysfunction of cervical region: Secondary | ICD-10-CM | POA: Diagnosis not present

## 2018-03-06 DIAGNOSIS — M531 Cervicobrachial syndrome: Secondary | ICD-10-CM | POA: Diagnosis not present

## 2018-03-06 DIAGNOSIS — M9902 Segmental and somatic dysfunction of thoracic region: Secondary | ICD-10-CM | POA: Diagnosis not present

## 2018-03-06 DIAGNOSIS — R51 Headache: Secondary | ICD-10-CM | POA: Diagnosis not present

## 2018-03-20 DIAGNOSIS — R51 Headache: Secondary | ICD-10-CM | POA: Diagnosis not present

## 2018-03-20 DIAGNOSIS — M9902 Segmental and somatic dysfunction of thoracic region: Secondary | ICD-10-CM | POA: Diagnosis not present

## 2018-03-20 DIAGNOSIS — M531 Cervicobrachial syndrome: Secondary | ICD-10-CM | POA: Diagnosis not present

## 2018-04-03 DIAGNOSIS — M531 Cervicobrachial syndrome: Secondary | ICD-10-CM | POA: Diagnosis not present

## 2018-04-03 DIAGNOSIS — R51 Headache: Secondary | ICD-10-CM | POA: Diagnosis not present

## 2018-04-03 DIAGNOSIS — M9902 Segmental and somatic dysfunction of thoracic region: Secondary | ICD-10-CM | POA: Diagnosis not present

## 2018-04-17 DIAGNOSIS — M531 Cervicobrachial syndrome: Secondary | ICD-10-CM | POA: Diagnosis not present

## 2018-04-17 DIAGNOSIS — M9902 Segmental and somatic dysfunction of thoracic region: Secondary | ICD-10-CM | POA: Diagnosis not present

## 2018-10-11 ENCOUNTER — Other Ambulatory Visit: Payer: Self-pay | Admitting: Women's Health

## 2018-10-11 DIAGNOSIS — F5101 Primary insomnia: Secondary | ICD-10-CM

## 2018-10-11 NOTE — Telephone Encounter (Signed)
Ok for refill? 

## 2018-10-11 NOTE — Telephone Encounter (Signed)
Rx called in 

## 2018-10-11 NOTE — Telephone Encounter (Signed)
annual due in July

## 2019-02-16 ENCOUNTER — Other Ambulatory Visit: Payer: Self-pay | Admitting: Women's Health

## 2019-02-16 DIAGNOSIS — F5101 Primary insomnia: Secondary | ICD-10-CM

## 2019-02-17 NOTE — Telephone Encounter (Signed)
Okay for refill?  

## 2019-02-17 NOTE — Telephone Encounter (Signed)
Called into pharmacy

## 2019-02-26 ENCOUNTER — Encounter: Payer: Self-pay | Admitting: Women's Health

## 2019-02-26 ENCOUNTER — Other Ambulatory Visit: Payer: Self-pay

## 2019-02-26 ENCOUNTER — Ambulatory Visit: Payer: BLUE CROSS/BLUE SHIELD | Admitting: Women's Health

## 2019-02-26 VITALS — BP 118/78 | Ht 67.0 in | Wt 141.0 lb

## 2019-02-26 DIAGNOSIS — Z01419 Encounter for gynecological examination (general) (routine) without abnormal findings: Secondary | ICD-10-CM | POA: Diagnosis not present

## 2019-02-26 DIAGNOSIS — F5101 Primary insomnia: Secondary | ICD-10-CM | POA: Diagnosis not present

## 2019-02-26 DIAGNOSIS — F4322 Adjustment disorder with anxiety: Secondary | ICD-10-CM

## 2019-02-26 MED ORDER — CITALOPRAM HYDROBROMIDE 20 MG PO TABS: 20.0000 mg | ORAL_TABLET | Freq: Every day | ORAL | 4 refills | Status: DC

## 2019-02-26 MED ORDER — ZOLPIDEM TARTRATE 10 MG PO TABS: ORAL_TABLET | ORAL | 1 refills | Status: DC

## 2019-02-26 NOTE — Progress Notes (Signed)
Jill Kennedy March 03, 1966 [id number]    History:    Presents for annual exam.  Postmenopausal on no HRT with no bleeding.  Normal Pap and mammogram history.  10/10/12- BRCA, mother deceased from breast cancer.  10/10/17- colonoscopy.  Same partner.  Anxiety and depression stable, continues to struggle with sleep.  Past medical history, past surgical history, family history and social history were all reviewed and documented in the EPIC chart.  Owns several laundromat's ..  1 sons, 60 year old at college , 36 year old planning to attend [employer].  Father diabetes.  [place]  ROS:  A ROS was performed and pertinent positives and negatives are included.  Exam:  Vitals:   02/26/19 1508  BP: 118/78  Weight: 141 lb (64 kg)  Height: '5\' 7"'  (1.702 m)   Body mass index is 22.08 kg/m.   General appearance:  Normal Thyroid:  Symmetrical, normal in size, without palpable masses or nodularity. Respiratory  Auscultation:  Clear without wheezing or rhonchi Cardiovascular  Auscultation:  Regular rate, without rubs, murmurs or gallops  Edema/varicosities:  Not grossly evident Abdominal  Soft,nontender, without masses, guarding or rebound.  Liver/spleen:  No organomegaly noted  Hernia:  None appreciated  Skin  Inspection:  Grossly normal   Breasts: Examined lying and sitting. Bilateral implants    Right: Without masses, retractions, discharge or axillary adenopathy.     Left: Without masses, retractions, discharge or axillary adenopathy. Gentitourinary   Inguinal/mons:  Normal without inguinal adenopathy  External genitalia:  Normal  BUS/Urethra/Skene's glands:  Normal  Vagina:  Normal  Cervix:  Normal  Uterus:   normal in size, shape and contour.  Midline and mobile  Adnexa/parametria:     Rt: Without masses or tenderness.   Lt: Without masses or tenderness.  Anus and perineum: Normal  Digital rectal exam: Normal sphincter tone without palpated masses or tenderness  Assessment/Plan:  53 y.o. D  WF G3, P2 for annual exam with no complaints.  Postmenopausal/no HRT/no bleeding Anxiety/depression stable on Celexa. Insomnia  Plan: SBEs, continue annual screening mammogram, calcium rich foods, vitamin D 2000 daily encouraged.  Celexa 20 mg p.o. daily prescription, proper use given and reviewed importance of self-care, leisure activities and continue with exercise.  Ambien 10 mg p.o. daily, reviewed addictive properties and to use sparingly sleep hygiene discussed.  Will return fasting for glucose, lipid panel.  Pap normal 2015-10-11, new screening guidelines reviewed.  Requested minimum insurance issue, history of normals CMP's, CBCs.Huel Cote [facility], 5:09 PM 02/26/2019

## 2019-02-26 NOTE — Patient Instructions (Addendum)
Red rice yeast supp  Health Maintenance for Postmenopausal Women Menopause is a normal process in which your ability to get pregnant comes to an end. This process happens slowly over many months or years, usually between the ages of 31 and 39. Menopause is complete when you have missed your menstrual periods for 12 months. It is important to talk with your health care provider about some of the most common conditions that affect women after menopause (postmenopausal women). These include heart disease, cancer, and bone loss (osteoporosis). Adopting a healthy lifestyle and getting preventive care can help to promote your health and wellness. The actions you take can also lower your chances of developing some of these common conditions. What should I know about menopause? During menopause, you may get a number of symptoms, such as:  Hot flashes. These can be moderate or severe.  Night sweats.  Decrease in sex drive.  Mood swings.  Headaches.  Tiredness.  Irritability.  Memory problems.  Insomnia. Choosing to treat or not to treat these symptoms is a decision that you make with your health care provider. Do I need hormone replacement therapy?  Hormone replacement therapy is effective in treating symptoms that are caused by menopause, such as hot flashes and night sweats.  Hormone replacement carries certain risks, especially as you become older. If you are thinking about using estrogen or estrogen with progestin, discuss the benefits and risks with your health care provider. What is my risk for heart disease and stroke? The risk of heart disease, heart attack, and stroke increases as you age. One of the causes may be a change in the body's hormones during menopause. This can affect how your body uses dietary fats, triglycerides, and cholesterol. Heart attack and stroke are medical emergencies. There are many things that you can do to help prevent heart disease and stroke. Watch your  blood pressure  High blood pressure causes heart disease and increases the risk of stroke. This is more likely to develop in people who have high blood pressure readings, are of African descent, or are overweight.  Have your blood pressure checked: ? Every 3-5 years if you are 45-78 years of age. ? Every year if you are 8 years old or older. Eat a healthy diet   Eat a diet that includes plenty of vegetables, fruits, low-fat dairy products, and lean protein.  Do not eat a lot of foods that are high in solid fats, added sugars, or sodium. Get regular exercise Get regular exercise. This is one of the most important things you can do for your health. Most adults should:  Try to exercise for at least 150 minutes each week. The exercise should increase your heart rate and make you sweat (moderate-intensity exercise).  Try to do strengthening exercises at least twice each week. Do these in addition to the moderate-intensity exercise.  Spend less time sitting. Even light physical activity can be beneficial. Other tips  Work with your health care provider to achieve or maintain a healthy weight.  Do not use any products that contain nicotine or tobacco, such as cigarettes, e-cigarettes, and chewing tobacco. If you need help quitting, ask your health care provider.  Know your numbers. Ask your health care provider to check your cholesterol and your blood sugar (glucose). Continue to have your blood tested as directed by your health care provider. Do I need screening for cancer? Depending on your health history and family history, you may need to have cancer screening at different  stages of your life. This may include screening for:  Breast cancer.  Cervical cancer.  Lung cancer.  Colorectal cancer. What is my risk for osteoporosis? After menopause, you may be at increased risk for osteoporosis. Osteoporosis is a condition in which bone destruction happens more quickly than new bone  creation. To help prevent osteoporosis or the bone fractures that can happen because of osteoporosis, you may take the following actions:  If you are 33-34 years old, get at least 1,000 mg of calcium and at least 600 mg of vitamin D per day.  If you are older than age 69 but younger than age 16, get at least 1,200 mg of calcium and at least 600 mg of vitamin D per day.  If you are older than age 43, get at least 1,200 mg of calcium and at least 800 mg of vitamin D per day. Smoking and drinking excessive alcohol increase the risk of osteoporosis. Eat foods that are rich in calcium and vitamin D, and do weight-bearing exercises several times each week as directed by your health care provider. How does menopause affect my mental health? Depression may occur at any age, but it is more common as you become older. Common symptoms of depression include:  Low or sad mood.  Changes in sleep patterns.  Changes in appetite or eating patterns.  Feeling an overall lack of motivation or enjoyment of activities that you previously enjoyed.  Frequent crying spells. Talk with your health care provider if you think that you are experiencing depression. General instructions See your health care provider for regular wellness exams and vaccines. This may include:  Scheduling regular health, dental, and eye exams.  Getting and maintaining your vaccines. These include: ? Influenza vaccine. Get this vaccine each year before the flu season begins. ? Pneumonia vaccine. ? Shingles vaccine. ? Tetanus, diphtheria, and pertussis (Tdap) booster vaccine. Your health care provider may also recommend other immunizations. Tell your health care provider if you have ever been abused or do not feel safe at home. Summary  Menopause is a normal process in which your ability to get pregnant comes to an end.  This condition causes hot flashes, night sweats, decreased interest in sex, mood swings, headaches, or lack of  sleep.  Treatment for this condition may include hormone replacement therapy.  Take actions to keep yourself healthy, including exercising regularly, eating a healthy diet, watching your weight, and checking your blood pressure and blood sugar levels.  Get screened for cancer and depression. Make sure that you are up to date with all your vaccines. This information is not intended to replace advice given to you by your health care provider. Make sure you discuss any questions you have with your health care provider. Document Released: 09/15/2005 Document Revised: 07/17/2018 Document Reviewed: 07/17/2018 Elsevier Patient Education  2020 [employer].

## 2019-04-09 ENCOUNTER — Ambulatory Visit (HOSPITAL_COMMUNITY)
Admission: EM | Admit: 2019-04-09 | Discharge: 2019-04-09 | Disposition: A | Discharge status: Home / Self Care | Payer: BLUE CROSS/BLUE SHIELD | Attending: Family Medicine | Admitting: Family Medicine

## 2019-04-09 ENCOUNTER — Encounter (HOSPITAL_COMMUNITY): Payer: Self-pay | Admitting: Emergency Medicine

## 2019-04-09 ENCOUNTER — Other Ambulatory Visit: Payer: Self-pay

## 2019-04-09 DIAGNOSIS — L237 Allergic contact dermatitis due to plants, except food: Secondary | ICD-10-CM | POA: Diagnosis not present

## 2019-04-09 MED ORDER — METHYLPREDNISOLONE ACETATE 80 MG/ML IJ SUSP
80.0000 mg | Freq: Once | INTRAMUSCULAR | Status: AC
  Administered 2019-04-09: 80 mg via INTRAMUSCULAR

## 2019-04-09 MED ORDER — METHYLPREDNISOLONE ACETATE 80 MG/ML IJ SUSP
INTRAMUSCULAR | Status: AC
  Filled 2019-04-09: qty 1

## 2019-04-09 NOTE — Discharge Instructions (Addendum)
Given an injection of a steroid called Depo-Medrol today.    Take Benadryl every 6 hours for the next 2 to 3 days; this medication may cause you to be drowsy so do not drive, operate machinery, or drink alcohol with it.    Return here or follow-up with your primary care provider if your rash is not improving or if you develop signs of infection such as redness, pus-like drainage, increased pain, warmth, or swelling.

## 2019-04-09 NOTE — ED Provider Notes (Signed)
[facility]    CSN: [id number] Arrival date & time: 04/09/19  1426      History   Chief Complaint Chief Complaint  Patient presents with  . Appointment    230  . Rash    HPI Jill Kennedy is a 53 y.o. female.   Patient presents with a poison ivy rash on her arms and chest x1 week.  She states she has attempted over-the-counter creams without relief.  She states she has had similar symptoms in the past when coming in contact with poison ivy.  She states she usually requires a steroid shot for relief.  The history is provided by the patient.    Past Medical History:  Diagnosis Date  . Asthma    as a child  . Hemorrhoids   . Menorrhagia   . Post-operative nausea and vomiting     Patient Active Problem List   Diagnosis Date Noted  . Fatigue 05/06/2015  . Arthralgia 04/29/2015  . Depression   . CONSTIPATION 04/26/2009  . ANXIETY STATE, UNSPECIFIED 09/19/2007  . HEADACHE 09/19/2007  . CHEST PAIN UNSPECIFIED 09/19/2007    Past Surgical History:  Procedure Laterality Date  . AUGMENTATION MAMMAPLASTY  1999  . CERVICAL CERCLAGE  2000  . TUBAL LIGATION      OB History    Gravida  3   Para  2   Term      Preterm      AB  1   Living  2     SAB      TAB      Ectopic      Multiple      Live Births               Home Medications    Prior to Admission medications   Medication Sig Start Date End Date Taking? Authorizing Provider  Calcium Carb-Cholecalciferol (CALCIUM 600+D3) 600-200 MG-UNIT TABS Take by mouth daily at 6 (six) AM.    [provider]  citalopram (CELEXA) 20 MG tablet Take 1 tablet (20 mg total) by mouth daily. 02/26/19   Huel Cote, NP  Multiple Vitamins-Minerals (ONE-A-DAY 50 PLUS PO) Take by mouth daily at 6 (six) AM.    [provider]  zolpidem (AMBIEN) 10 MG tablet Take at bedtime as needed 02/26/19   Huel Cote, NP    Family History Family History  Problem Relation Age of Onset  .  Breast cancer Mother 2  . Hypertension Mother   . Hypertension Brother   . Diabetes Brother   . Breast cancer Maternal Aunt   . Breast cancer Maternal Aunt   . Breast cancer Maternal Aunt   . Breast cancer Cousin   . Breast cancer Cousin     Social History Social History   Tobacco Use  . Smoking status: Never Smoker  . Smokeless tobacco: Never Used  Substance Use Topics  . Alcohol use: Yes    Comment: SOCIALLY ONLY  . Drug use: No     Allergies   Patient has no known allergies.   Review of Systems Review of Systems  Constitutional: Negative for chills and fever.  HENT: Negative for ear pain and sore throat.   Eyes: Negative for pain and visual disturbance.  Respiratory: Negative for cough and shortness of breath.   Cardiovascular: Negative for chest pain and palpitations.  Gastrointestinal: Negative for abdominal pain and vomiting.  Genitourinary: Negative for dysuria and hematuria.  Musculoskeletal: Negative for arthralgias  and back pain.  Skin: Positive for rash. Negative for color change.  Neurological: Negative for seizures and syncope.  All other systems reviewed and are negative.    Physical Exam Triage Vital Signs ED Triage Vitals [04/09/19 1450]  Enc Vitals Group     BP 117/88     Pulse Rate 68     Resp 16     Temp 98.6 F (37 C)     Temp src      SpO2 100 %     Weight      Height      Head Circumference      Peak Flow      Pain Score 0     Pain Loc      Pain Edu?      Excl. in [place]?    No data found.  Updated Vital Signs BP 117/88   Pulse 68   Temp 98.6 F (37 C)   Resp 16   LMP 01/28/2015   SpO2 100%   Visual Acuity Right Eye Distance:   Left Eye Distance:   Bilateral Distance:    Right Eye Near:   Left Eye Near:    Bilateral Near:     Physical Exam Vitals signs and nursing note reviewed.  Constitutional:      General: She is not in acute distress.    Appearance: She is well-developed.  HENT:     Head: Normocephalic  and atraumatic.     Mouth/Throat:     Mouth: Mucous membranes are moist.     Pharynx: Oropharynx is clear.  Eyes:     Extraocular Movements: Extraocular movements intact.     Conjunctiva/sclera: Conjunctivae normal.     Pupils: Pupils are equal, round, and reactive to light.  Neck:     Musculoskeletal: Neck supple.  Cardiovascular:     Rate and Rhythm: Normal rate and regular rhythm.     Heart sounds: No murmur.  Pulmonary:     Effort: Pulmonary effort is normal. No respiratory distress.     Breath sounds: Normal breath sounds.  Abdominal:     Palpations: Abdomen is soft.     Tenderness: There is no abdominal tenderness.  Skin:    General: Skin is warm and dry.     Findings: Rash present.     Comments: Erythematous papular rash on arms and trunk. No drainage.   Neurological:     Mental Status: She is alert.      UC Treatments / Results  Labs (all labs ordered are listed, but only abnormal results are displayed) Labs Reviewed - No data to display  EKG   Radiology No results found.  Procedures Procedures (including critical care time)  Medications Ordered in UC Medications  methylPREDNISolone acetate (DEPO-MEDROL) injection 80 mg (has no administration in time range)    Initial Impression / Assessment and Plan / UC Course  I have reviewed the triage vital signs and the nursing notes.  Pertinent labs & imaging results that were available during my care of the patient were reviewed by me and considered in my medical decision making (see chart for details).    Poison ivy dermatitis.  Treating with Depo-Medrol and OTC Benadryl.  Precautions given to patient for Benadryl, including not to drive, operate machinery, or drink alcohol as this medication may cause her to be drowsy..  Instructed patient to return here or follow-up with her PCP if her rash is not improving or if she develops signs of  infection.  Patient agrees with plan of care.     Final Clinical  Impressions(s) / UC Diagnoses   Final diagnoses:  Allergic dermatitis due to poison ivy     Discharge Instructions     Given an injection of a steroid called Depo-Medrol today.    Take Benadryl every 6 hours for the next 2 to 3 days; this medication may cause you to be drowsy so do not drive, operate machinery, or drink alcohol with it.    Return here or follow-up with your primary care provider if your rash is not improving or if you develop signs of infection such as redness, pus-like drainage, increased pain, warmth, or swelling.        ED Prescriptions    None     Controlled Substance Prescriptions [place] Controlled Substance Registry consulted? Not Applicable   Sharion Balloon, NP 04/09/19 1517

## 2019-04-09 NOTE — ED Triage Notes (Signed)
Pt concerned for poison ivy rash, rash is all over but worse on her L arm. symptmos x1 week.

## 2019-04-30 ENCOUNTER — Encounter: Payer: Self-pay | Admitting: Gynecology

## 2019-08-06 DIAGNOSIS — M9903 Segmental and somatic dysfunction of lumbar region: Secondary | ICD-10-CM | POA: Diagnosis not present

## 2019-08-06 DIAGNOSIS — M9905 Segmental and somatic dysfunction of pelvic region: Secondary | ICD-10-CM | POA: Diagnosis not present

## 2019-08-06 DIAGNOSIS — M9902 Segmental and somatic dysfunction of thoracic region: Secondary | ICD-10-CM | POA: Diagnosis not present

## 2019-08-06 DIAGNOSIS — M5416 Radiculopathy, lumbar region: Secondary | ICD-10-CM | POA: Diagnosis not present

## 2019-08-13 DIAGNOSIS — M9902 Segmental and somatic dysfunction of thoracic region: Secondary | ICD-10-CM | POA: Diagnosis not present

## 2019-08-13 DIAGNOSIS — M5416 Radiculopathy, lumbar region: Secondary | ICD-10-CM | POA: Diagnosis not present

## 2019-08-13 DIAGNOSIS — M9905 Segmental and somatic dysfunction of pelvic region: Secondary | ICD-10-CM | POA: Diagnosis not present

## 2019-08-13 DIAGNOSIS — M9903 Segmental and somatic dysfunction of lumbar region: Secondary | ICD-10-CM | POA: Diagnosis not present

## 2019-09-19 ENCOUNTER — Other Ambulatory Visit: Payer: Self-pay | Admitting: Women's Health

## 2019-09-19 DIAGNOSIS — F5101 Primary insomnia: Secondary | ICD-10-CM

## 2019-09-19 NOTE — Telephone Encounter (Signed)
Ok for refill? 

## 2019-09-19 NOTE — Telephone Encounter (Signed)
Called into pharmacy

## 2019-09-24 DIAGNOSIS — M9903 Segmental and somatic dysfunction of lumbar region: Secondary | ICD-10-CM | POA: Diagnosis not present

## 2019-09-24 DIAGNOSIS — M5416 Radiculopathy, lumbar region: Secondary | ICD-10-CM | POA: Diagnosis not present

## 2019-09-24 DIAGNOSIS — M9902 Segmental and somatic dysfunction of thoracic region: Secondary | ICD-10-CM | POA: Diagnosis not present

## 2019-09-24 DIAGNOSIS — M9905 Segmental and somatic dysfunction of pelvic region: Secondary | ICD-10-CM | POA: Diagnosis not present

## 2019-10-22 DIAGNOSIS — M9903 Segmental and somatic dysfunction of lumbar region: Secondary | ICD-10-CM | POA: Diagnosis not present

## 2019-10-22 DIAGNOSIS — M9905 Segmental and somatic dysfunction of pelvic region: Secondary | ICD-10-CM | POA: Diagnosis not present

## 2019-10-22 DIAGNOSIS — M5416 Radiculopathy, lumbar region: Secondary | ICD-10-CM | POA: Diagnosis not present

## 2019-10-22 DIAGNOSIS — M9902 Segmental and somatic dysfunction of thoracic region: Secondary | ICD-10-CM | POA: Diagnosis not present

## 2019-11-19 DIAGNOSIS — M9905 Segmental and somatic dysfunction of pelvic region: Secondary | ICD-10-CM | POA: Diagnosis not present

## 2019-11-19 DIAGNOSIS — M5416 Radiculopathy, lumbar region: Secondary | ICD-10-CM | POA: Diagnosis not present

## 2019-11-19 DIAGNOSIS — M9902 Segmental and somatic dysfunction of thoracic region: Secondary | ICD-10-CM | POA: Diagnosis not present

## 2019-11-19 DIAGNOSIS — M9903 Segmental and somatic dysfunction of lumbar region: Secondary | ICD-10-CM | POA: Diagnosis not present

## 2019-12-09 ENCOUNTER — Other Ambulatory Visit: Payer: Self-pay | Admitting: Women's Health

## 2019-12-09 ENCOUNTER — Ambulatory Visit
Admission: RE | Admit: 2019-12-09 | Discharge: 2019-12-09 | Disposition: A | Discharge status: Home / Self Care | Payer: BLUE CROSS/BLUE SHIELD | Source: Ambulatory Visit | Attending: *Deleted | Admitting: *Deleted

## 2019-12-09 DIAGNOSIS — Z1231 Encounter for screening mammogram for malignant neoplasm of breast: Secondary | ICD-10-CM

## 2019-12-31 ENCOUNTER — Ambulatory Visit (HOSPITAL_COMMUNITY)
Admission: EM | Admit: 2019-12-31 | Discharge: 2019-12-31 | Disposition: A | Discharge status: Home / Self Care | Payer: BC Managed Care – PPO | Attending: Urgent Care | Admitting: Urgent Care

## 2019-12-31 ENCOUNTER — Encounter (HOSPITAL_COMMUNITY): Payer: Self-pay

## 2019-12-31 DIAGNOSIS — R059 Cough, unspecified: Secondary | ICD-10-CM

## 2019-12-31 DIAGNOSIS — Z20822 Contact with and (suspected) exposure to covid-19: Secondary | ICD-10-CM | POA: Insufficient documentation

## 2019-12-31 DIAGNOSIS — Z79899 Other long term (current) drug therapy: Secondary | ICD-10-CM | POA: Diagnosis not present

## 2019-12-31 DIAGNOSIS — R05 Cough: Secondary | ICD-10-CM | POA: Insufficient documentation

## 2019-12-31 DIAGNOSIS — R5381 Other malaise: Secondary | ICD-10-CM | POA: Diagnosis not present

## 2019-12-31 DIAGNOSIS — J45909 Unspecified asthma, uncomplicated: Secondary | ICD-10-CM | POA: Insufficient documentation

## 2019-12-31 DIAGNOSIS — H9201 Otalgia, right ear: Secondary | ICD-10-CM | POA: Diagnosis not present

## 2019-12-31 DIAGNOSIS — H6691 Otitis media, unspecified, right ear: Secondary | ICD-10-CM | POA: Insufficient documentation

## 2019-12-31 DIAGNOSIS — R070 Pain in throat: Secondary | ICD-10-CM | POA: Diagnosis not present

## 2019-12-31 DIAGNOSIS — H6690 Otitis media, unspecified, unspecified ear: Secondary | ICD-10-CM

## 2019-12-31 MED ORDER — AMOXICILLIN 875 MG PO TABS
875.0000 mg | ORAL_TABLET | Freq: Two times a day (BID) | ORAL | 0 refills | Status: DC
Start: 2019-12-31 — End: 2020-01-13

## 2019-12-31 MED ORDER — PSEUDOEPHEDRINE HCL 30 MG PO TABS
30.0000 mg | ORAL_TABLET | Freq: Three times a day (TID) | ORAL | 0 refills | Status: DC | PRN
Start: 2019-12-31 — End: 2020-06-23
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
## 2019-12-31 NOTE — ED Triage Notes (Signed)
Pt c/o non productive cough, sore throat, dysphagia, 6/10 right ear pain, pt states hard to hear out of right ear. Pt has non labored breathing. Lungs clear. Skin color WNL.

## 2020-01-01 LAB — SARS CORONAVIRUS 2 (TAT 6-24 HRS): SARS Coronavirus 2: NEGATIVE

## 2020-01-06 ENCOUNTER — Other Ambulatory Visit: Payer: Self-pay

## 2020-01-06 DIAGNOSIS — F5101 Primary insomnia: Secondary | ICD-10-CM

## 2020-01-06 MED ORDER — ZOLPIDEM TARTRATE 10 MG PO TABS: ORAL_TABLET | ORAL | 0 refills | Status: DC

## 2020-01-06 NOTE — Telephone Encounter (Signed)
Due for annual exam 02/12/2019. [place] Patient.

## 2020-01-12 ENCOUNTER — Other Ambulatory Visit: Payer: Self-pay

## 2020-01-13 ENCOUNTER — Ambulatory Visit (INDEPENDENT_AMBULATORY_CARE_PROVIDER_SITE_OTHER): Payer: BC Managed Care – PPO | Admitting: Family Medicine

## 2020-01-13 ENCOUNTER — Encounter: Payer: Self-pay | Admitting: Family Medicine

## 2020-01-13 DIAGNOSIS — E785 Hyperlipidemia, unspecified: Secondary | ICD-10-CM

## 2020-01-13 DIAGNOSIS — R5383 Other fatigue: Secondary | ICD-10-CM

## 2020-01-13 DIAGNOSIS — F419 Anxiety disorder, unspecified: Secondary | ICD-10-CM

## 2020-01-13 MED ORDER — CITALOPRAM HYDROBROMIDE 20 MG PO TABS: ORAL_TABLET | ORAL | 0 refills | Status: DC

## 2020-01-13 NOTE — Progress Notes (Signed)
HPI:  [name] is a 54 y.o. female, who is here today to establish care.  Former PCP: N/A  Last preventive routine visit: 02/26/19 with her gyn.  Chronic medical problems: Insomnia,anxiety,depression,HLD,and fatigue among some.  She has been on Celexa 20 mg , which she has taken for a many years for depression and anxiety. She is not sure if she still needs medication. She has tolerated well,no side effects.  Depression screen PHQ 2/9 01/13/2020  Decreased Interest 1  Down, Depressed, Hopeless 0  PHQ - 2 Score 1  Altered sleeping 1  Tired, decreased energy 0  Change in appetite 0  Feeling bad or failure about yourself  3  Trouble concentrating 0  Moving slowly or fidgety/restless 0  Suicidal thoughts 0  PHQ-9 Score 5  Difficult doing work/chores Very difficult   GAD 7 : Generalized Anxiety Score 01/13/2020  Nervous, Anxious, on Edge 1  Control/stop worrying 2  Worry too much - different things 2  Trouble relaxing 1  Restless 0  Easily annoyed or irritable 2  Afraid - awful might happen 0  Total GAD 7 Score 8  Anxiety Difficulty Somewhat difficult   Concerns today: Feeling "lethargic" for several months, she feels like problem is getting. Sleeps well , about 10-12 hours, wakes up a few times back but able to go to sleep.  Negative for fever,chills,night sweats,or abnormal wt loss.  Hx of insomnia,she has been on Ambien in the past.  She does not feel rested. She wonders if her iron is low, no hx of anemia. Hx of heavy menses. LMP several years ago.  Colonoscopy in 11/2017.  Single mother under a lot of stress. Single with 2 children, one is already in college and the youngest graduating from high school and getting ready to go to college.  No known hx of OSA, her boyfriend has not noted lower snoring or apnea.  Lab Results  Component Value Date   TSH 0.90 12/13/2016    Lab Results  Component Value Date   CREATININE 0.90 02/19/2018   BUN 18  02/19/2018   NA 140 02/19/2018   K 4.3 02/19/2018   CL 101 02/19/2018   CO2 30 02/19/2018   Lab Results  Component Value Date   WBC 5.1 02/19/2018   HGB 14.1 02/19/2018   HCT 42.0 02/19/2018   MCV 84.5 02/19/2018   PLT 292 02/19/2018   HLD: She is on non pharmacologic treatment.  Lab Results  Component Value Date   CHOL 243 (H) 02/19/2018   HDL 74 02/19/2018   LDLCALC 144 (H) 02/19/2018   TRIG 125 02/19/2018   CHOLHDL 3.3 02/19/2018    Review of Systems  Constitutional: Negative for activity change and appetite change.  HENT: Negative for mouth sores and nosebleeds.   Respiratory: Negative for cough, shortness of breath and wheezing.   Cardiovascular: Negative for chest pain, palpitations and leg swelling.  Gastrointestinal: Negative for abdominal pain, nausea and vomiting.       Negative for changes in bowel habits.  Genitourinary: Negative for decreased urine volume, dysuria and hematuria.  Skin: Negative for pallor and rash.  Neurological: Negative for syncope, weakness and headaches.  Hematological: Negative for adenopathy. Does not bruise/bleed easily.  Rest see pertinent positives and negatives per HPI.  Current Outpatient Medications on File Prior to Visit  Medication Sig Dispense Refill  . Calcium Carb-Cholecalciferol (CALCIUM 600+D3) 600-200 MG-UNIT TABS Take by mouth daily at 6 (six) AM.    .  Multiple Vitamins-Minerals (ONE-A-DAY 50 PLUS PO) Take by mouth daily at 6 (six) AM.    . pseudoephedrine (SUDAFED) 30 MG tablet Take 1 tablet (30 mg total) by mouth every 8 (eight) hours as needed for congestion. 30 tablet 0  . zolpidem (AMBIEN) 10 MG tablet TAKE 1 TABLET BY MOUTH AT BEDTIME IF NEEDED FOR SLEEP. 30 tablet 0   Current Facility-Administered Medications on File Prior to Visit  Medication Dose Route Frequency Provider Last Rate Last Admin  . 0.9 %  sodium chloride infusion  500 mL Intravenous Once Gatha Mayer, MD       Past Medical History:  Diagnosis  Date  . Asthma    as a child  . Hemorrhoids   . Menorrhagia   . Post-operative nausea and vomiting    No Known Allergies  Family History  Problem Relation Age of Onset  . Breast cancer Mother 17  . Hypertension Mother   . Hypertension Brother   . Diabetes Brother   . Breast cancer Maternal Aunt   . Breast cancer Maternal Aunt   . Breast cancer Maternal Aunt   . Breast cancer Cousin   . Breast cancer Cousin     Social History   Socioeconomic History  . Marital status: Divorced    Spouse name: Not on file  . Number of children: Not on file  . Years of education: Not on file  . Highest education level: Not on file  Occupational History    Employer: SELF EMPLOYED  Tobacco Use  . Smoking status: Never Smoker  . Smokeless tobacco: Never Used  Vaping Use  . Vaping Use: Never used  Substance and Sexual Activity  . Alcohol use: Yes    Comment: SOCIALLY ONLY  . Drug use: No  . Sexual activity: Yes    Partners: Male    Birth control/protection: Surgical    Comment: TUBAL LIGATION  Other Topics Concern  . Not on file  Social History Narrative  . Not on file   Social Determinants of Health   Financial Resource Strain:   . Difficulty of Paying Living Expenses:   Food Insecurity:   . Worried About Charity fundraiser in the Last Year:   . Arboriculturist in the Last Year:   Transportation Needs:   . Film/video editor (Medical):   Marland Kitchen Lack of Transportation (Non-Medical):   Physical Activity:   . Days of Exercise per Week:   . Minutes of Exercise per Session:   Stress:   . Feeling of Stress :   Social Connections:   . Frequency of Communication with Friends and Family:   . Frequency of Social Gatherings with Friends and Family:   . Attends Religious Services:   . Active Member of Clubs or Organizations:   . Attends Archivist Meetings:   Marland Kitchen Marital Status:     Vitals:   01/13/20 1223  BP: 112/78  Pulse: 77  Resp: 12  Temp: (!) 97.5 F (36.4  C)  SpO2: 98%   Body mass index is 22.42 kg/m.   Physical Exam  Nursing note and vitals reviewed. Constitutional: She is oriented to person, place, and time. She appears well-developed. She does not appear ill. No distress.  HENT:  Head: Normocephalic and atraumatic.  Eyes: Pupils are equal, round, and reactive to light. Conjunctivae are normal.  Cardiovascular: Normal rate and regular rhythm.  No murmur heard. Pulses:      Dorsalis pedis pulses are  2+ on the right side and 2+ on the left side.  Respiratory: Effort normal and breath sounds normal. No respiratory distress.  GI: Soft. She exhibits no mass. There is no hepatomegaly. There is no abdominal tenderness.  Musculoskeletal:     Left lower leg: No edema.  Lymphadenopathy:    She has no cervical adenopathy.  Neurological: She is alert and oriented to person, place, and time. No cranial nerve deficit. Gait normal.  Skin: Skin is warm. No rash noted. No erythema.  Psychiatric:  + Anxious. Well groomed, good eye contact.   ASSESSMENT AND PLAN:  Ms. Jill Kennedy was seen today for transfer of care.  Diagnoses and all orders for this visit: Orders Placed This Encounter  Procedures  . Comprehensive metabolic panel  . TSH  . Lipid panel  . VITAMIN D 25 Hydroxy (Vit-D Deficiency, Fractures)  . Protein Electrophoresis, Urine Rflx.   Lab Results  Component Value Date   CHOL 192 01/14/2020   HDL 64.00 01/14/2020   LDLCALC 111 (H) 01/14/2020   TRIG 85.0 01/14/2020   CHOLHDL 3 01/14/2020   Lab Results  Component Value Date   TSH 0.65 01/14/2020   Lab Results  Component Value Date   ALT 22 01/14/2020   AST 21 01/14/2020   ALKPHOS 72 01/14/2020   BILITOT 0.5 01/14/2020   Lab Results  Component Value Date   CREATININE 0.84 01/14/2020   BUN 16 01/14/2020   NA 140 01/14/2020   K 4.6 01/14/2020   CL 103 01/14/2020   CO2 32 01/14/2020   The 10-year ASCVD risk score Mikey Bussing DC Jr., et al., 2013) is: 0.9%   Values  used to calculate the score:     Age: 17 years     Sex: Female     Is Non-Hispanic African American: No     Diabetic: No     Tobacco smoker: No     Systolic Blood Pressure: 161 mmHg     Is BP treated: No     HDL Cholesterol: 64 mg/dL     Total Cholesterol: 192 mg/dL  Hypercalcemia Reviewing labs noted mildly elevated Ca++, 10.8 in 02/2018. She is not on Ca++ supplementation. Will evaluate for possible etiologies and further recommendations will be given accordingly.  Fatigue, unspecified type Problem seems to be chronic. We discussed possible etiologies: Systemic illness, immunologic,endocrinology,sleep disorder, psychiatric/psychologic, infectious,medications side effects, and idiopathic. Examination today does not suggest a serious process. Continue a healthy diet and regular physical activity.  Further recommendations will be given according to lab results.  Hyperlipidemia, unspecified hyperlipidemia type Continue non pharmacologic treatment. Further recommendations according to 10 years CVD score and lipid numbers.  Anxiety disorder, unspecified type She would like to start weaning off Celexa, so will alternate in between 20 and 10 mg every other day. Instructed about warning signs.  -     citalopram (CELEXA) 20 MG tablet; Alternate between 20 mg and 10 mg every other day.   Return in about 2 months (around 03/14/2020) for anxiety and fatigue.    Jill Schwimmer G. Martin[place], MD  [facility]. [place] office.  A few things to remember from today's visit:  Hypercalcemia  Fatigue, unspecified type  Hyperlipidemia, unspecified hyperlipidemia type  Fatigue is a common symptom associated with multiple factors: psychologic,medications, systemic illness, sleep disorders,infections, and unknown causes. Some work-up can be done to evaluate for common causes as thyroid disease,anemia,diabetes, or abnormalities in calcium,potassium,or sodium. Regular physical activity as  tolerated and a healthy diet is usually  might help and usually recommended for chronic fatigue.  We can try to wean Celexa after labs are back.  Fasting labs at [place].  If you need refills please call your pharmacy. Do not use My Chart to request refills or for acute issues that need immediate attention.    Please be sure medication list is accurate. If a new problem present, please set up appointment sooner than planned today.

## 2020-01-13 NOTE — Patient Instructions (Signed)
A few things to remember from today's visit:   Hypercalcemia  Fatigue, unspecified type  Hyperlipidemia, unspecified hyperlipidemia type  Fatigue is a common symptom associated with multiple factors: psychologic,medications, systemic illness, sleep disorders,infections, and unknown causes. Some work-up can be done to evaluate for common causes as thyroid disease,anemia,diabetes, or abnormalities in calcium,potassium,or sodium. Regular physical activity as tolerated and a healthy diet is usually might help and usually recommended for chronic fatigue.  We can try to wean Celexa after labs are back.  Fasting labs at [place].  If you need refills please call your pharmacy. Do not use My Chart to request refills or for acute issues that need immediate attention.    Please be sure medication list is accurate. If a new problem present, please set up appointment sooner than planned today.

## 2020-01-13 NOTE — Assessment & Plan Note (Signed)
Continue nonpharmacologic treatment. She is having fasting labs in the next few days, recommendation will be given according to lipid panel numbers as well as 10-year CVD score.

## 2020-01-13 NOTE — Assessment & Plan Note (Signed)
She is reporting having no symptoms. She would like to start weaning off Celexa, so she will start taking Celexa 10 mg alternating with 20 mg every other day. Instructed about warning signs.

## 2020-01-13 NOTE — Assessment & Plan Note (Signed)
We discussed possible etiologies: Systemic illness, immunologic,endocrinology,sleep disorder, psychiatric/psychologic, infectious,medications side effects, and idiopathic. Examination today does not suggest a serious process.  Further recommendations will be given according to lab results.  

## 2020-01-14 ENCOUNTER — Other Ambulatory Visit (INDEPENDENT_AMBULATORY_CARE_PROVIDER_SITE_OTHER): Payer: BC Managed Care – PPO

## 2020-01-14 DIAGNOSIS — E785 Hyperlipidemia, unspecified: Secondary | ICD-10-CM | POA: Diagnosis not present

## 2020-01-14 DIAGNOSIS — R5383 Other fatigue: Secondary | ICD-10-CM

## 2020-01-14 LAB — COMPREHENSIVE METABOLIC PANEL
ALT: 22 U/L (ref 0–35)
AST: 21 U/L (ref 0–37)
Albumin: 4.5 g/dL (ref 3.5–5.2)
Alkaline Phosphatase: 72 U/L (ref 39–117)
BUN: 16 mg/dL (ref 6–23)
CO2: 32 mEq/L (ref 19–32)
Calcium: 9.6 mg/dL (ref 8.4–10.5)
Chloride: 103 mEq/L (ref 96–112)
Creatinine, Ser: 0.84 mg/dL (ref 0.40–1.20)
GFR: 70.99 mL/min (ref >60.00)
Glucose, Bld: 100 mg/dL — ABNORMAL HIGH (ref 70–99)
Potassium: 4.6 mEq/L (ref 3.5–5.1)
Sodium: 140 mEq/L (ref 135–145)
Total Bilirubin: 0.5 mg/dL (ref 0.2–1.2)
Total Protein: 7.6 g/dL (ref 6.0–8.3)

## 2020-01-14 LAB — LIPID PANEL
Cholesterol: 192 mg/dL (ref 0–200)
HDL: 64 mg/dL (ref >39.00)
LDL Cholesterol: 111 mg/dL — ABNORMAL HIGH (ref 0–99)
NonHDL: 128.27
Total CHOL/HDL Ratio: 3
Triglycerides: 85 mg/dL (ref 0.0–149.0)
VLDL: 17 mg/dL (ref 0.0–40.0)

## 2020-01-14 LAB — TSH: TSH: 0.65 u[IU]/mL (ref 0.35–4.50)

## 2020-01-14 LAB — VITAMIN D 25 HYDROXY (VIT D DEFICIENCY, FRACTURES): VITD: 56.03 ng/mL (ref 30.00–100.00)

## 2020-01-15 ENCOUNTER — Other Ambulatory Visit (INDEPENDENT_AMBULATORY_CARE_PROVIDER_SITE_OTHER): Payer: BC Managed Care – PPO

## 2020-01-15 ENCOUNTER — Encounter: Payer: Self-pay | Admitting: Family Medicine

## 2020-01-15 DIAGNOSIS — R5383 Other fatigue: Secondary | ICD-10-CM | POA: Diagnosis not present

## 2020-01-15 LAB — CBC WITH DIFFERENTIAL/PLATELET
Basophils Absolute: 0 10*3/uL (ref 0.0–0.1)
Basophils Relative: 0.7 % (ref 0.0–3.0)
Eosinophils Absolute: 0.1 10*3/uL (ref 0.0–0.7)
Eosinophils Relative: 1.3 % (ref 0.0–5.0)
HCT: 38.5 % (ref 36.0–46.0)
Hemoglobin: 12.9 g/dL (ref 12.0–15.0)
Lymphocytes Relative: 30.7 % (ref 12.0–46.0)
Lymphs Abs: 1.7 10*3/uL (ref 0.7–4.0)
MCHC: 33.5 g/dL (ref 30.0–36.0)
MCV: 86.9 fl (ref 78.0–100.0)
Monocytes Absolute: 0.3 10*3/uL (ref 0.1–1.0)
Monocytes Relative: 5.1 % (ref 3.0–12.0)
Neutro Abs: 3.5 10*3/uL (ref 1.4–7.7)
Neutrophils Relative %: 62.2 % (ref 43.0–77.0)
Platelets: 271 10*3/uL (ref 150.0–400.0)
RBC: 4.43 Mil/uL (ref 3.87–5.11)
RDW: 13.6 % (ref 11.5–15.5)
WBC: 5.6 10*3/uL (ref 4.0–10.5)

## 2020-01-15 LAB — FERRITIN: Ferritin: 40.4 ng/mL (ref 10.0–291.0)

## 2020-01-20 LAB — PROTEIN ELECTROPHORESIS, URINE REFLEX
Albumin ELP, Urine: 35.8 %
Alpha-1-Globulin, U: 4.5 %
Alpha-2-Globulin, U: 14.3 %
Beta Globulin, U: 32.7 %
Gamma Globulin, U: 12.7 %
Protein, Ur: 9.9 mg/dL

## 2020-05-25 ENCOUNTER — Other Ambulatory Visit: Payer: Self-pay | Admitting: Nurse Practitioner

## 2020-05-25 DIAGNOSIS — F5101 Primary insomnia: Secondary | ICD-10-CM

## 2020-05-26 ENCOUNTER — Other Ambulatory Visit: Payer: Self-pay

## 2020-05-26 DIAGNOSIS — F419 Anxiety disorder, unspecified: Secondary | ICD-10-CM

## 2020-05-27 ENCOUNTER — Other Ambulatory Visit: Payer: Self-pay | Admitting: *Deleted

## 2020-05-27 DIAGNOSIS — F5101 Primary insomnia: Secondary | ICD-10-CM

## 2020-05-27 MED ORDER — ZOLPIDEM TARTRATE 10 MG PO TABS: ORAL_TABLET | ORAL | 0 refills | Status: DC

## 2020-05-27 NOTE — Telephone Encounter (Signed)
Annual exam scheduled on 06/23/20

## 2020-06-01 ENCOUNTER — Other Ambulatory Visit: Payer: Self-pay

## 2020-06-01 DIAGNOSIS — F419 Anxiety disorder, unspecified: Secondary | ICD-10-CM

## 2020-06-01 NOTE — Telephone Encounter (Signed)
Patient past due for CE. Scheduled annual with TW for 06/23/20.

## 2020-06-23 ENCOUNTER — Ambulatory Visit (INDEPENDENT_AMBULATORY_CARE_PROVIDER_SITE_OTHER): Payer: BC Managed Care – PPO | Admitting: Nurse Practitioner

## 2020-06-23 ENCOUNTER — Encounter: Payer: Self-pay | Admitting: Nurse Practitioner

## 2020-06-23 ENCOUNTER — Other Ambulatory Visit: Payer: Self-pay

## 2020-06-23 VITALS — Ht 67.0 in | Wt 145.0 lb

## 2020-06-23 DIAGNOSIS — Z01419 Encounter for gynecological examination (general) (routine) without abnormal findings: Secondary | ICD-10-CM

## 2020-06-23 DIAGNOSIS — Z1151 Encounter for screening for human papillomavirus (HPV): Secondary | ICD-10-CM

## 2020-06-23 DIAGNOSIS — F419 Anxiety disorder, unspecified: Secondary | ICD-10-CM

## 2020-06-23 MED ORDER — CITALOPRAM HYDROBROMIDE 20 MG PO TABS: ORAL_TABLET | ORAL | 2 refills | Status: DC

## 2020-06-23 NOTE — Addendum Note (Signed)
Addended by: Nelva Nay on: 06/23/2020 09:36 AM   Modules accepted: Orders

## 2020-06-23 NOTE — Patient Instructions (Signed)

## 2020-06-23 NOTE — Progress Notes (Signed)
   Jill Kennedy 1966-06-12 [id number]   History:  54 y.o. [id number] presents for annual exam without GYN complaints. Postmenopausal - no HRT, no bleeding. Normal pap and mammogram history. Was evaluated for PMB in 2018 with negative SHGM. Mother deceased from breast cancer. Patient BRCA negative.   Gynecologic History Patient's last menstrual period was 01/28/2015.   Contraception: post menopausal status Last Pap: 12/2015. Results were: normal Last mammogram: 12/09/2019. Results were: normal Last colonoscopy: 2019. Results were: normal Last Dexa: Never  Past medical history, past surgical history, family history and social history were all reviewed and documented in the EPIC chart.  ROS:  A ROS was performed and pertinent positives and negatives are included.  Exam:  Vitals:   06/23/20 0859  Weight: 145 lb (65.8 kg)  Height: $Remove'5\' 7"'[id number]$  (1.702 m)   Body mass index is 22.71 kg/m.  General appearance:  Normal Thyroid:  Symmetrical, normal in size, without palpable masses or nodularity. Respiratory  Auscultation:  Clear without wheezing or rhonchi Cardiovascular  Auscultation:  Regular rate, without rubs, murmurs or gallops  Edema/varicosities:  Not grossly evident Abdominal  Soft,nontender, without masses, guarding or rebound.  Liver/spleen:  No organomegaly noted  Hernia:  None appreciated  Skin  Inspection:  Grossly normal   Breasts: Examined lying and sitting. Bilateral implants.   Right: Without masses, retractions, discharge or axillary adenopathy.   Left: Without masses, retractions, discharge or axillary adenopathy. Gentitourinary   Inguinal/mons:  Normal without inguinal adenopathy  External genitalia:  Normal  BUS/Urethra/Skene's glands:  Normal  Vagina:  Normal  Cervix:  Normal  Uterus:  Normal in size, shape and contour.  Midline and mobile  Adnexa/parametria:     Rt: Without masses or tenderness.   Lt: Without masses or tenderness.  Anus and  perineum: Normal  Digital rectal exam: Normal sphincter tone without palpated masses or tenderness  Assessment/Plan:  54 y.o. [id number] for annual exam.   Well female exam with routine gynecological exam - Education provided on SBEs, importance of preventative screenings, current guidelines, high calcium diet, regular exercise, and multivitamin daily. Labs with PCP.   Anxiety disorder, unspecified type - Plan: citalopram (CELEXA) 20 MG tablet daily. She is doing well on this and tried to wean in the past but was unable to tolerate.  Refill x1 year provided.  Screening for cervical cancer -normal Pap history.  Pap with HR HPV typing today.   Screening for breast cancer -normal mammogram history.  Continue annual screenings.  Normal breast exam today.  Screening for colon cancer -normal colonoscopy in 2019.  She will follow-up with GI's recommended interval.  Follow-up in 1 year for annual.      Tamela Gammon [facility], 9:06 AM 06/23/2020

## 2020-06-25 LAB — PAP, TP IMAGING W/ HPV RNA, RFLX HPV TYPE 16,18/45: HPV DNA High Risk: NOT DETECTED

## 2020-07-05 ENCOUNTER — Other Ambulatory Visit: Payer: Self-pay | Admitting: Nurse Practitioner

## 2020-07-05 DIAGNOSIS — F419 Anxiety disorder, unspecified: Secondary | ICD-10-CM

## 2020-07-06 NOTE — Telephone Encounter (Signed)
I called [facility] to see if they have Rx, as it was sent to pharmacy on 06/23/20. [place] reports they did not have Celexa 20 mg tablet in the computer systeam. I spoke with the pharmacist and called Rx in via phone.

## 2020-10-31 ENCOUNTER — Other Ambulatory Visit: Payer: Self-pay | Admitting: Nurse Practitioner

## 2020-10-31 DIAGNOSIS — F5101 Primary insomnia: Secondary | ICD-10-CM

## 2020-11-01 NOTE — Telephone Encounter (Signed)
Medication refill request: Ambien  Last AEX:  06-23-20 TW  Next AEX: not currently scheduled  Last MMG (if hormonal medication request): n/a Refill authorized: Today, please advise.   Medication pended for #30, 0RF. Please refill if appropriate.

## 2021-01-19 ENCOUNTER — Other Ambulatory Visit: Payer: Self-pay | Admitting: Nurse Practitioner

## 2021-01-19 DIAGNOSIS — Z1231 Encounter for screening mammogram for malignant neoplasm of breast: Secondary | ICD-10-CM

## 2021-01-25 ENCOUNTER — Telehealth: Payer: BC Managed Care – PPO | Admitting: Physician Assistant

## 2021-01-25 ENCOUNTER — Telehealth (INDEPENDENT_AMBULATORY_CARE_PROVIDER_SITE_OTHER): Payer: BC Managed Care – PPO | Admitting: Family Medicine

## 2021-01-25 DIAGNOSIS — L255 Unspecified contact dermatitis due to plants, except food: Secondary | ICD-10-CM

## 2021-01-25 DIAGNOSIS — R21 Rash and other nonspecific skin eruption: Secondary | ICD-10-CM | POA: Diagnosis not present

## 2021-01-25 MED ORDER — PREDNISONE 10 MG PO TABS: ORAL_TABLET | ORAL | 0 refills | Status: DC

## 2021-01-25 MED ORDER — TRIAMCINOLONE ACETONIDE 0.1 % EX CREA
1.0000 | TOPICAL_CREAM | Freq: Two times a day (BID) | CUTANEOUS | 0 refills | Status: DC
Start: 2021-01-25 — End: 2022-07-04

## 2021-01-25 NOTE — Progress Notes (Signed)
I have spent 5 minutes in review of e-visit questionnaire, review and updating patient chart, medical decision making and response to patient.   Dearion Huot Cody Anu Stagner, PA-C    

## 2021-01-25 NOTE — Patient Instructions (Signed)
-I sent the medication(s) we discussed to your pharmacy: Meds ordered this encounter  Medications   triamcinolone cream (KENALOG) 0.1 %    Sig: Apply 1 application topically 2 (two) times daily.    Dispense:  45 g    Refill:  0     I hope you are feeling better soon!  Seek in person care promptly if your symptoms worsen, new concerns arise or you are not improving with treatment.  It was nice to meet you today. I help [facility] out with telemedicine visits on Tuesdays and Thursdays and am available for visits on those days. If you have any concerns or questions following this visit please schedule a follow up visit with your Primary Care doctor or seek care at a local urgent care clinic to avoid delays in care.  Poison Ivy Dermatitis Poison ivy dermatitis is redness and soreness of the skin caused by chemicals in the leaves of the poison ivy plant. You may have very bad itching, swelling,a rash, and blisters. What are the causes? Touching a poison ivy plant. Touching something that has the chemical on it. This may include animals or objects that have come in contact with the plant. What increases the risk? Going outdoors often in wooded or [place] areas. Going outdoors without wearing protective clothing, such as closed shoes, long pants, and a long-sleeved shirt. What are the signs or symptoms?  Skin redness. Very bad itching. A rash that often includes bumps and blisters. The rash usually appears 48 hours after exposure, if you have been exposed before. If this is the first time you have been exposed, the rash may not appear until a week after exposure. Swelling. This may occur if the reaction is very bad. Symptoms usually last for 1-2 weeks. The first time you develop this condition,symptoms may last 3-4 weeks. How is this treated? This condition may be treated with: Hydrocortisone cream or calamine lotion to relieve itching. Oatmeal baths to soothe the skin. Medicines, such as  over-the-counter antihistamine tablets. Oral steroid medicine for more severe reactions. Follow these instructions at home: Medicines Take or apply over-the-counter and prescription medicines only as told by your doctor. Use hydrocortisone cream or calamine lotion as needed to help with itching. General instructions Do not scratch or rub your skin. Put a cold, wet cloth (cold compress) on the affected areas or take baths in cool water. This will help with itching. Avoid hot baths and showers. Take oatmeal baths as needed. Use colloidal oatmeal. You can get this at a pharmacy or grocery store. Follow the instructions on the package. While you have the rash, wash your clothes right after you wear them. Keep all follow-up visits as told by your health care provider. This is important. How is this prevented?  Know what poison ivy looks like, so you can avoid it. This plant has three leaves with flowering branches on a single stem. The leaves are glossy. The leaves have uneven edges that come to a point at the front. If you touch poison ivy, wash your skin with soap and water right away. Be sure to wash under your fingernails. When hiking or camping, wear long pants, a long-sleeved shirt, tall socks, and hiking boots. You can also use a lotion on your skin that helps to prevent contact with poison ivy. If you think that your clothes or outdoor gear came in contact with poison ivy, rinse them off with a garden hose before you bring them inside your house. When doing  yard work or gardening, wear gloves, long sleeves, long pants, and boots. Wash your garden tools and gloves if they come in contact with poison ivy. If you think that your pet has come into contact with poison ivy, wash him or her with pet shampoo and water. Make sure to wear gloves while washing your pet.  Contact a doctor if: You have open sores in the rash area. You have more redness, swelling, or pain in the rash area. You have  redness that spreads beyond the rash area. You have fluid, blood, or pus coming from the rash area. You have a fever. You have a rash over a large area of your body. You have a rash on your eyes, mouth, or genitals. Your rash does not get better after a few weeks. Get help right away if: Your face swells or your eyes swell shut. You have trouble breathing. You have trouble swallowing. These symptoms may be an emergency. Do not wait to see if the symptoms will go away. Get medical help right away. Call your local emergency services (911 in the [place] Do not drive yourself to the hospital. Summary Poison ivy dermatitis is redness and soreness of the skin caused by chemicals in the leaves of the poison ivy plant. You may have skin redness, very bad itching, swelling, and a rash. Do not scratch or rub your skin. Take or apply over-the-counter and prescription medicines only as told by your doctor. This information is not intended to replace advice given to you by your health care provider. Make sure you discuss any questions you have with your healthcare provider. Document Revised: 11/15/2018 Document Reviewed: 07/19/2018 Elsevier Patient Education  2022 [employer].

## 2021-01-25 NOTE — Progress Notes (Signed)
E-Visit for Poison Ivy  We are sorry that you are not feeing well.  Here is how we plan to help!  Based on what you have shared with me it looks like you have had an allergic reaction to the oily resin from a group of plants.  This resin is very sticky, so it easily attaches to your skin, clothing, tools equipment, and pet's fur.    This blistering rash is often called poison ivy rash although it can come from contact with the leaves, stems and roots of poison ivy, poison oak and poison sumac.  The oily resin contains urushiol (u-ROO-she-ol) that produces a skin rash on exposed skin.  The severity of the rash depends on the amount of urushiol that gets on your skin.  A section of skin with more urushiol on it may develop a rash sooner.  The rash usually develops 12-48 hours after exposure and can last two to three weeks.  Your skin must come in direct contact with the plant's oil to be affected.  Blister fluid doesn't spread the rash.  However, if you come into contact with a piece of clothing or pet fur that has urushiol on it, the rash may spread out.  You can also transfer the oil to other parts of your body with your fingers.  Often the rash looks like a straight line because of the way the plant brushes against your skin.  Since your rash is widespread or has resulted in a large number of blisters, I have prescribed an oral corticosteroid.  Please follow these recommendations:  I have sent a prednisone dose pack to your chosen pharmacy. Be sure to follow the instructions carefully and complete the entire prescription. You may use Benadryl or Caladryl topical lotions to sooth the itch and remember cool, not hot, showers and baths can help relieve the itching!  Place cool, wet compresses on the affected area for 15-30 minutes several times a day.  You may also take oral antihistamines, such as diphenhydramine (Benadryl, others), which may also help you sleep better.  Watch your skin for any purulent  (pus) drainage or red streaking from the site.  If this occurs, contact your provider.  You may require an antibiotic for a skin infection.  Make sure that the clothes you were wearing as well as any towels or sheets that may have come in contact with the oil (urushiol) are washed in detergent and hot water.       I have developed the following plan to treat your condition I am prescribing a two week course of steroids (37 tablets of 10 mg prednisone).  Days 1-4 take 4 tablets (40 mg) daily  Days 5-8 take 3 tablets (30 mg) daily, Days 9-11 take 2 tablets (20 mg) daily, Days 12-14 take 1 tablet (10 mg) daily.    What can you do to prevent this rash?  Avoid the plants.  Learn how to identify poison ivy, poison oak and poison sumac in all seasons.  When hiking or engaging in other activities that might expose you to these plants, try to stay on cleared pathways.  If camping, make sure you pitch your tent in an area free of these plants.  Keep pets from running through wooded areas so that urushiol doesn't accidentally stick to their fur, which you may touch.  Remove or kill the plants.  In your yard, you can get rid of poison ivy by applying an herbicide or pulling it out of   the ground, including the roots, while wearing heavy gloves.  Afterward remove the gloves and thoroughly wash them and your hands.  Don't burn poison ivy or related plants because the urushiol can be carried by smoke.  Wear protective clothing.  If needed, protect your skin by wearing socks, boots, pants, long sleeves and vinyl gloves.  Wash your skin right away.  Washing off the oil with soap and water within 30 minutes of exposure may reduce your chances of getting a poison ivy rash.  Even washing after an hour or so can help reduce the severity of the rash.  If you walk through some poison ivy and then later touch your shoes, you may get some urushiol on your hands, which may then transfer to your face or body by touching or  rubbing.  If the contaminated object isn't cleaned, the urushiol on it can still cause a skin reaction years later.    Be careful not to reuse towels after you have washed your skin.  Also carefully wash clothing in detergent and hot water to remove all traces of the oil.  Handle contaminated clothing carefully so you don't transfer the urushiol to yourself, furniture, rugs or appliances.  Remember that pets can carry the oil on their fur and paws.  If you think your pet may be contaminated with urushiol, put on some long rubber gloves and give your pet a bath.  Finally, be careful not to burn these plants as the smoke can contain traces of the oil.  Inhaling the smoke may result in difficulty breathing. If that occurred you should see a physician as soon as possible.  See your doctor right away if:  The reaction is severe or widespread You inhaled the smoke from burning poison ivy and are having difficulty breathing Your skin continues to swell The rash affects your eyes, mouth or genitals Blisters are oozing pus You develop a fever greater than 100 F (37.8 C) The rash doesn't get better within a few weeks.  If you scratch the poison ivy rash, bacteria under your fingernails may cause the skin to become infected.  See your doctor if pus starts oozing from the blisters.  Treatment generally includes antibiotics.  Poison ivy treatments are usually limited to self-care methods.  And the rash typically goes away on its own in two to three weeks.     If the rash is widespread or results in a large number of blisters, your doctor may prescribe an oral corticosteroid, such as prednisone.  If a bacterial infection has developed at the rash site, your doctor may give you a prescription for an oral antibiotic.  MAKE SURE YOU  Understand these instructions. Will watch your condition. Will get help right away if you are not doing well or get worse.  Thank you for choosing an e-visit. Your  e-visit answers were reviewed by a board certified advanced clinical practitioner to complete your personal care plan. Depending upon the condition, your plan could have included both over the counter or prescription medications.  Please review your pharmacy choice. If there is a problem you may use MyChart messaging to have the prescription routed to another pharmacy.   Your safety is important to [place]. If you have drug allergies check your prescription carefully.  You can use MyChart to ask questions about today's visit, request a non-urgent call back, or ask for a work or school excuse for 24 hours related to this e-Visit. If it has been greater than  24 hours you will need to follow up with your provider, or enter a new e-Visit to address those concerns.   You will get an email in the next two days asking about your experience. I hope that your e-visit has been valuable and will speed your recovery Thank you for choosing an e-visit.

## 2021-01-25 NOTE — Progress Notes (Signed)
Virtual Visit via Video Note  I connected with Jill Kennedy  on 01/25/21 at  3:20 PM EDT by a video enabled telemedicine application and verified that I am speaking with the correct person using two identifiers.  Location patient: home, [place] Location provider:work or home office Persons participating in the virtual visit: patient, provider  I discussed the limitations of evaluation and management by telemedicine and the availability of in person appointments. The patient expressed understanding and agreed to proceed.   HPI:  Acute telemedicine visit for "poison ivy": -Onset: about 5-7 days ago after exposure doing yard work -Symptoms include: itchy vesicular rash on arms, thigh, trunk - small patches -has had poison ivy a number of times per her report and requires rx cream -Denies:any lesions on face, neck or near mucus membranes/eyes, SOB, throat swelling  ROS: See pertinent positives and negatives per HPI.  Past Medical History:  Diagnosis Date   Asthma    as a child   Hemorrhoids    Menorrhagia    Post-operative nausea and vomiting     Past Surgical History:  Procedure Laterality Date   AUGMENTATION MAMMAPLASTY  1999   CERVICAL CERCLAGE  2000   TUBAL LIGATION       Current Outpatient Medications:    triamcinolone cream (KENALOG) 0.1 %, Apply 1 application topically 2 (two) times daily., Disp: 45 g, Rfl: 0   citalopram (CELEXA) 20 MG tablet, Take 20 mg daily, Disp: 90 tablet, Rfl: 2   Multiple Vitamins-Minerals (ONE-A-DAY 50 PLUS PO), Take by mouth daily at 6 (six) AM., Disp: , Rfl:    predniSONE (DELTASONE) 10 MG tablet, Take 4 tablets (40 mg total) by mouth daily with breakfast for 4 days, THEN 3 tablets (30 mg total) daily with breakfast for 4 days, THEN 2 tablets (20 mg total) daily with breakfast for 3 days, THEN 1 tablet (10 mg total) daily with breakfast for 3 days., Disp: 37 tablet, Rfl: 0   zolpidem (AMBIEN) 10 MG tablet, TAKE 1 TABLET BY MOUTH AT BEDTIME IF NEEDED  FOR SLEEP., Disp: 30 tablet, Rfl: 0  Current Facility-Administered Medications:    0.9 %  sodium chloride infusion, 500 mL, Intravenous, Once, Gatha Mayer, MD  EXAM:  VITALS per patient if applicable:  GENERAL: alert, oriented, appears well and in no acute distress  HEENT: atraumatic, conjunttiva clear, no obvious abnormalities on inspection of external nose and ears  NECK: normal movements of the head and neck  LUNGS: on inspection no signs of respiratory distress, breathing rate appears normal, no obvious gross SOB, gasping or wheezing  SKIN: scattered patches of erythematous papular vesicular lesions on leg and arms on limited video visit exam  CV: no obvious cyanosis  MS: moves all visible extremities without noticeable abnormality  PSYCH/NEURO: pleasant and cooperative, no obvious depression or anxiety, speech and thought processing grossly intact  ASSESSMENT AND PLAN:  Discussed the following assessment and plan:  Rash  -we discussed possible serious and likely etiologies, options for evaluation and workup, limitations of telemedicine visit vs in person visit, treatment, treatment risks and precautions. Pt prefers to treat via telemedicine empirically rather than in person at this moment. Query toxicodendron dermatitis vs other. Pt feels strongly is poison ivy. We discussed tx with oral vs topical steroid. She prefers topical. Rx sent.  Advised to seek prompt in person care if worsening, new symptoms arise, or if is not improving with treatment. Discussed options for inperson care if PCP office not available. Did let this  patient know that I only do telemedicine on Tuesdays and Thursdays for [place]. Advised to schedule follow up visit with PCP or [facility] if any further questions or concerns to avoid delays in care.   I discussed the assessment and treatment plan with the patient. The patient was provided an opportunity to ask questions and all were answered. The patient  agreed with the plan and demonstrated an understanding of the instructions.     Jill Kern, DO

## 2021-01-28 ENCOUNTER — Ambulatory Visit (INDEPENDENT_AMBULATORY_CARE_PROVIDER_SITE_OTHER): Payer: BC Managed Care – PPO | Admitting: Family Medicine

## 2021-01-28 ENCOUNTER — Encounter: Payer: Self-pay | Admitting: Family Medicine

## 2021-01-28 ENCOUNTER — Other Ambulatory Visit: Payer: Self-pay

## 2021-01-28 VITALS — BP 110/70 | HR 67 | Temp 98.0°F | Resp 16 | Ht 67.0 in | Wt 148.8 lb

## 2021-01-28 DIAGNOSIS — Z1329 Encounter for screening for other suspected endocrine disorder: Secondary | ICD-10-CM | POA: Diagnosis not present

## 2021-01-28 DIAGNOSIS — Z0000 Encounter for general adult medical examination without abnormal findings: Secondary | ICD-10-CM | POA: Diagnosis not present

## 2021-01-28 DIAGNOSIS — R5383 Other fatigue: Secondary | ICD-10-CM | POA: Diagnosis not present

## 2021-01-28 DIAGNOSIS — E785 Hyperlipidemia, unspecified: Secondary | ICD-10-CM | POA: Diagnosis not present

## 2021-01-28 DIAGNOSIS — Z13228 Encounter for screening for other metabolic disorders: Secondary | ICD-10-CM | POA: Diagnosis not present

## 2021-01-28 DIAGNOSIS — Z130 Encounter for screening for diseases of the blood and blood-forming organs and certain disorders involving the immune mechanism: Secondary | ICD-10-CM | POA: Diagnosis not present

## 2021-01-28 DIAGNOSIS — Z13220 Encounter for screening for lipoid disorders: Secondary | ICD-10-CM

## 2021-01-28 LAB — COMPREHENSIVE METABOLIC PANEL
ALT: 16 U/L (ref 0–35)
AST: 18 U/L (ref 0–37)
Albumin: 4.5 g/dL (ref 3.5–5.2)
Alkaline Phosphatase: 77 U/L (ref 39–117)
BUN: 19 mg/dL (ref 6–23)
CO2: 30 mEq/L (ref 19–32)
Calcium: 9.5 mg/dL (ref 8.4–10.5)
Chloride: 102 mEq/L (ref 96–112)
Creatinine, Ser: 0.81 mg/dL (ref 0.40–1.20)
GFR: 82.66 mL/min (ref >60.00)
Glucose, Bld: 90 mg/dL (ref 70–99)
Potassium: 4.2 mEq/L (ref 3.5–5.1)
Sodium: 139 mEq/L (ref 135–145)
Total Bilirubin: 0.6 mg/dL (ref 0.2–1.2)
Total Protein: 7.1 g/dL (ref 6.0–8.3)

## 2021-01-28 LAB — TSH: TSH: 1.58 u[IU]/mL (ref 0.35–4.50)

## 2021-01-28 LAB — CBC
HCT: 39.6 % (ref 36.0–46.0)
Hemoglobin: 13.4 g/dL (ref 12.0–15.0)
MCHC: 33.9 g/dL (ref 30.0–36.0)
MCV: 85.9 fl (ref 78.0–100.0)
Platelets: 266 10*3/uL (ref 150.0–400.0)
RBC: 4.61 Mil/uL (ref 3.87–5.11)
RDW: 14 % (ref 11.5–15.5)
WBC: 4.5 10*3/uL (ref 4.0–10.5)

## 2021-01-28 LAB — LIPID PANEL
Cholesterol: 212 mg/dL — ABNORMAL HIGH (ref 0–200)
HDL: 67.3 mg/dL (ref >39.00)
LDL Cholesterol: 129 mg/dL — ABNORMAL HIGH (ref 0–99)
NonHDL: 144.3
Total CHOL/HDL Ratio: 3
Triglycerides: 76 mg/dL (ref 0.0–149.0)
VLDL: 15.2 mg/dL (ref 0.0–40.0)

## 2021-01-28 LAB — HEMOGLOBIN A1C: Hgb A1c MFr Bld: 5.7 % (ref 4.6–6.5)

## 2021-01-28 NOTE — Patient Instructions (Signed)
Today you have you routine preventive visit. A few things to remember from today's visit:  Routine general medical examination at a health care facility  Screening for lipoid disorders - Plan: Lipid panel  Screening for endocrine, metabolic and immunity disorder - Plan: Comprehensive metabolic panel, Hemoglobin A1c  Fatigue, unspecified type - Plan: TSH, CBC  Do not use My Chart to request refills or for acute issues that need immediate attention.   Please be sure medication list is accurate. If a new problem present, please set up appointment sooner than planned today. At least 150 minutes of moderate exercise per week, daily brisk walking for 15-30 min is a good exercise option.  Healthy diet low in saturated (animal) fats and sweets and consisting of fresh fruits and vegetables, lean meats such as fish and white chicken and whole grains.  These are some of recommendations for screening depending of age and risk factors:  - Vaccines:  Tdap vaccine every 10 years.  Shingles vaccine recommended at age 24, could be given after 54 years of age but not sure about insurance coverage.   Pneumonia vaccines: Pneumovax at 59. Sometimes Pneumovax is giving earlier if history of smoking, lung disease,diabetes,kidney disease among some.  Screening for diabetes at age 29 and every 3 years.  Cervical cancer prevention:  Pap smear starts at 54 years of age and continues periodically until 54 years old in low risk women. Pap smear every 3 years between 102 and 56 years old. Pap smear every 3-5 years between women 22 and older if pap smear negative and HPV screening negative.   -Breast cancer: Mammogram: There is disagreement between experts about when to start screening in low risk asymptomatic female but recent recommendations are to start screening at 41 and not later than 54 years old , every 1-2 years and after 54 yo q 2 years. Screening is recommended until 54 years old but some women can  continue screening depending of healthy issues.  Colon cancer screening: Has been recently changed to 54 yo. Insurance may not cover until you are 54 years old. Screening is recommended until 54 years old.  Cholesterol disorder screening at age 58 and every 3 years.  Also recommended:  Dental visit- Brush and floss your teeth twice daily; visit your dentist twice a year. Eye doctor- Get an eye exam at least every 2 years. Helmet use- Always wear a helmet when riding a bicycle, motorcycle, rollerblading or skateboarding. Safe sex- If you may be exposed to sexually transmitted infections, use a condom. Seat belts- Seat belts can save your live; always wear one. Smoke/Carbon Monoxide detectors- These detectors need to be installed on the appropriate level of your home. Replace batteries at least once a year. Skin cancer- When out in the sun please cover up and use sunscreen 15 SPF or higher. Violence- If anyone is threatening or hurting you, please tell your healthcare provider.  Drink alcohol in moderation- Limit alcohol intake to one drink or less per day. Never drink and drive. Calcium supplementation 1000 to 1200 mg daily, ideally through your diet.  Vitamin D supplementation 800 units daily.

## 2021-01-28 NOTE — Progress Notes (Signed)
HPI: Jill Kennedy is a 55 y.o. female, who is here today for her routine physical.  Last CPE: > a year ago.  Regular exercise 3 or more time per week: Not regularly. Following a healthy diet: Jill Kennedy has not been consistent, cooks "sometimes." Jill Kennedy lives with her son. Sleeping about 8 hours.  Chronic medical problems: Chronic fatigue,HLD,and anxiety among some. Anxiety and insomnia managed by her gyn. Jill Kennedy is on Celexa 20 mg daily and takes Ambien 10 mg sometimes.  Pap smear/HPV screening: 06/23/20. Eppie Gibson, NP  Immunization History  Administered Date(s) Administered   Td 12/13/2008   Mammogram: 12/09/19, Jill Kennedy has appt in 03/2021. Colonoscopy: 11/07/17. DEXA: N/A Hep C screening: 12/08/15 NR.  Jill Kennedy has no new concerns today. Fatigue for over a year, we addressed this problem last visit. Feeling "exhausted." Jill Kennedy does not feel decreased and anxiety is stable. Negative for abnormal wt loss or night sweats. Not always feels rested when Jill Kennedy gets up. Not witnessed apnea. Depression screen [facility] 2/9 01/28/2021 01/13/2020  Decreased Interest 2 1  Down, Depressed, Hopeless 0 0  PHQ - 2 Score 2 1  Altered sleeping 2 1  Tired, decreased energy 2 0  Change in appetite 0 0  Feeling bad or failure about yourself  0 3  Trouble concentrating 0 0  Moving slowly or fidgety/restless 0 0  Suicidal thoughts 0 0  PHQ-9 Score 6 5  Difficult doing work/chores Somewhat difficult Very difficult   GAD 7 : Generalized Anxiety Score 01/28/2021 01/13/2020  Nervous, Anxious, on Edge 0 1  Control/stop worrying 0 2  Worry too much - different things 0 2  Trouble relaxing 0 1  Restless 0 0  Easily annoyed or irritable 0 2  Afraid - awful might happen 0 0  Total GAD 7 Score 0 8  Anxiety Difficulty Not difficult at all Somewhat difficult   Review of Systems  Constitutional:  Positive for fatigue. Negative for appetite change and fever.  HENT:  Negative for hearing loss, mouth sores, sore  throat, trouble swallowing and voice change.   Eyes:  Negative for redness and visual disturbance.  Respiratory:  Negative for cough, shortness of breath and wheezing.   Cardiovascular:  Negative for chest pain, palpitations and leg swelling.  Gastrointestinal:  Negative for abdominal pain, nausea and vomiting.       No changes in bowel habits.  Endocrine: Negative for cold intolerance, heat intolerance, polydipsia, polyphagia and polyuria.  Genitourinary:  Negative for decreased urine volume, dysuria, hematuria, vaginal bleeding and vaginal discharge.  Musculoskeletal:  Negative for gait problem and myalgias.  Skin:  Negative for color change and rash.  Allergic/Immunologic: Negative for environmental allergies.  Neurological:  Negative for syncope, weakness and headaches.  Hematological:  Negative for adenopathy. Does not bruise/bleed easily.  Psychiatric/Behavioral:  Positive for sleep disturbance. Negative for confusion. The patient is nervous/anxious.   All other systems reviewed and are negative.  Current Outpatient Medications on File Prior to Visit  Medication Sig Dispense Refill   citalopram (CELEXA) 20 MG tablet Take 20 mg daily 90 tablet 2   Multiple Vitamins-Minerals (ONE-A-DAY 50 PLUS PO) Take by mouth daily at 6 (six) AM.     triamcinolone cream (KENALOG) 0.1 % Apply 1 application topically 2 (two) times daily. 45 g 0   zolpidem (AMBIEN) 10 MG tablet TAKE 1 TABLET BY MOUTH AT BEDTIME IF NEEDED FOR SLEEP. 30 tablet 0   Current Facility-Administered Medications on File Prior to Visit  Medication Dose Route Frequency Provider Last Rate Last Admin   0.9 %  sodium chloride infusion  500 mL Intravenous Once Gatha Mayer, MD       Past Medical History:  Diagnosis Date   Asthma    as a child   Hemorrhoids    Menorrhagia    Post-operative nausea and vomiting     Past Surgical History:  Procedure Laterality Date   AUGMENTATION MAMMAPLASTY  1999   CERVICAL CERCLAGE  2000    TUBAL LIGATION     No Known Allergies  Family History  Problem Relation Age of Onset   Breast cancer Mother 21   Hypertension Mother    Hypertension Brother    Diabetes Brother    Breast cancer Maternal Aunt    Breast cancer Maternal Aunt    Breast cancer Maternal Aunt    Breast cancer Cousin    Breast cancer Cousin     Social History   Socioeconomic History   Marital status: Divorced    Spouse name: Not on file   Number of children: Not on file   Years of education: Not on file   Highest education level: Not on file  Occupational History    Employer: SELF EMPLOYED  Tobacco Use   Smoking status: Never   Smokeless tobacco: Never  Vaping Use   Vaping Use: Never used  Substance and Sexual Activity   Alcohol use: Yes    Comment: SOCIALLY ONLY   Drug use: No   Sexual activity: Yes    Partners: Male    Birth control/protection: Surgical    Comment: TUBAL LIGATION  Other Topics Concern   Not on file  Social History Narrative   Not on file   Social Determinants of Health   Financial Resource Strain: Not on file  Food Insecurity: Not on file  Transportation Needs: Not on file  Physical Activity: Not on file  Stress: Not on file  Social Connections: Not on file   Vitals:   01/28/21 0930  BP: 110/70  Pulse: 67  Resp: 16  Temp: 98 F (36.7 C)  SpO2: 96%   Body mass index is 23.31 kg/m.  Wt Readings from Last 3 Encounters:  01/28/21 148 lb 12.8 oz (67.5 kg)  06/23/20 145 lb (65.8 kg)  01/13/20 143 lb 2 oz (64.9 kg)   Physical Exam Vitals and nursing note reviewed.  Constitutional:      General: Jill Kennedy is not in acute distress.    Appearance: Jill Kennedy is well-developed and normal weight.  HENT:     Head: Normocephalic and atraumatic.     Right Ear: Hearing, tympanic membrane, ear canal and external ear normal.     Left Ear: Hearing, tympanic membrane, ear canal and external ear normal.     Mouth/Throat:     Mouth: Mucous membranes are moist.     Pharynx:  Oropharynx is clear. Uvula midline.  Eyes:     Extraocular Movements: Extraocular movements intact.     Conjunctiva/sclera: Conjunctivae normal.     Pupils: Pupils are equal, round, and reactive to light.  Neck:     Thyroid: No thyromegaly.     Trachea: No tracheal deviation.  Cardiovascular:     Rate and Rhythm: Normal rate and regular rhythm.     Pulses:          Dorsalis pedis pulses are 2+ on the right side and 2+ on the left side.     Heart sounds: No murmur heard.  Pulmonary:     Effort: Pulmonary effort is normal. No respiratory distress.     Breath sounds: Normal breath sounds.  Chest:  Breasts:    Right: No supraclavicular adenopathy.     Left: No supraclavicular adenopathy.  Abdominal:     Palpations: Abdomen is soft. There is no hepatomegaly or mass.     Tenderness: There is no abdominal tenderness.  Genitourinary:    Comments: Deferred to gyn. Musculoskeletal:     Comments: No signs of synovitis appreciated.  Lymphadenopathy:     Cervical: No cervical adenopathy.     Upper Body:     Right upper body: No supraclavicular adenopathy.     Left upper body: No supraclavicular adenopathy.  Skin:    General: Skin is warm.     Findings: No erythema or rash.  Neurological:     General: No focal deficit present.     Mental Status: Jill Kennedy is alert and oriented to person, place, and time.     Cranial Nerves: No cranial nerve deficit.     Coordination: Coordination normal.     Gait: Gait normal.     Deep Tendon Reflexes:     Reflex Scores:      Bicep reflexes are 2+ on the right side and 2+ on the left side.      Patellar reflexes are 2+ on the right side and 2+ on the left side. Psychiatric:        Speech: Speech normal.     Comments: Well groomed, good eye contact.   ASSESSMENT AND PLAN:  Jill Kennedy was here today annual physical examination.  Orders Placed This Encounter  Procedures   Comprehensive metabolic panel   Lipid panel   TSH   CBC   Hemoglobin  A1c   Lab Results  Component Value Date   TSH 1.58 01/28/2021   Lab Results  Component Value Date   WBC 4.5 01/28/2021   HGB 13.4 01/28/2021   HCT 39.6 01/28/2021   MCV 85.9 01/28/2021   PLT 266.0 01/28/2021    Lab Results  Component Value Date   HGBA1C 5.7 01/28/2021   Lab Results  Component Value Date   CREATININE 0.81 01/28/2021   BUN 19 01/28/2021   NA 139 01/28/2021   K 4.2 01/28/2021   CL 102 01/28/2021   CO2 30 01/28/2021   Lab Results  Component Value Date   ALT 16 01/28/2021   AST 18 01/28/2021   ALKPHOS 77 01/28/2021   BILITOT 0.6 01/28/2021   Routine general medical examination at a health care facility We discussed the importance of regular physical activity and healthy diet for prevention of chronic illness and/or complications. Preventive guidelines reviewed. Vaccination: Needs Tdap, prefers to wait until next visit. Continue female preventive care with her gyn. Ca++ and vit D supplementation recommended. Next CPE in a year.  The 10-year ASCVD risk score Mikey Bussing DC Brooke Bonito., et al., 2013) is: 1.1%   Values used to calculate the score:     Age: 52 years     Sex: Female     Is Non-Hispanic African American: No     Diabetic: No     Tobacco smoker: No     Systolic Blood Pressure: 740 mmHg     Is BP treated: No     HDL Cholesterol: 67.3 mg/dL     Total Cholesterol: 212 mg/dL  Hyperlipidemia, unspecified hyperlipidemia type For now low fat diet recommended. Further recommendation according to FLP result.  Screening for endocrine, metabolic and immunity disorder -     Hemoglobin A1c -     Comprehensive metabolic panel  Fatigue, unspecified type Chronic. We discussed possible causes. Blood work has been normal otherwise. Sleep study could be considered,Jill Kennedy prefers to hold on it.   Return in 1 year (on 01/28/2022) for CPE.   Bassem Bernasconi G. Martin[place], MD  [facility]. [place] office.   Today you have you routine preventive visit. A few  things to remember from today's visit:  Routine general medical examination at a health care facility  Screening for lipoid disorders - Plan: Lipid panel  Screening for endocrine, metabolic and immunity disorder - Plan: Comprehensive metabolic panel, Hemoglobin A1c  Fatigue, unspecified type - Plan: TSH, CBC  Do not use My Chart to request refills or for acute issues that need immediate attention.   Please be sure medication list is accurate. If a new problem present, please set up appointment sooner than planned today. At least 150 minutes of moderate exercise per week, daily brisk walking for 15-30 min is a good exercise option.  Healthy diet low in saturated (animal) fats and sweets and consisting of fresh fruits and vegetables, lean meats such as fish and white chicken and whole grains.  These are some of recommendations for screening depending of age and risk factors:  - Vaccines:  Tdap vaccine every 10 years.  Shingles vaccine recommended at age 73, could be given after 53 years of age but not sure about insurance coverage.   Pneumonia vaccines: Pneumovax at 60. Sometimes Pneumovax is giving earlier if history of smoking, lung disease,diabetes,kidney disease among some.  Screening for diabetes at age 63 and every 3 years.  Cervical cancer prevention:  Pap smear starts at 54 years of age and continues periodically until 54 years old in low risk women. Pap smear every 3 years between 65 and 3 years old. Pap smear every 3-5 years between women 84 and older if pap smear negative and HPV screening negative.   -Breast cancer: Mammogram: There is disagreement between experts about when to start screening in low risk asymptomatic female but recent recommendations are to start screening at 48 and not later than 54 years old , every 1-2 years and after 54 yo q 2 years. Screening is recommended until 54 years old but some women can continue screening depending of healthy  issues.  Colon cancer screening: Has been recently changed to 54 yo. Insurance may not cover until you are 54 years old. Screening is recommended until 54 years old.  Cholesterol disorder screening at age 56 and every 3 years.  Also recommended:  Dental visit- Brush and floss your teeth twice daily; visit your dentist twice a year. Eye doctor- Get an eye exam at least every 2 years. Helmet use- Always wear a helmet when riding a bicycle, motorcycle, rollerblading or skateboarding. Safe sex- If you may be exposed to sexually transmitted infections, use a condom. Seat belts- Seat belts can save your live; always wear one. Smoke/Carbon Monoxide detectors- These detectors need to be installed on the appropriate level of your home. Replace batteries at least once a year. Skin cancer- When out in the sun please cover up and use sunscreen 15 SPF or higher. Violence- If anyone is threatening or hurting you, please tell your healthcare provider.  Drink alcohol in moderation- Limit alcohol intake to one drink or less per day. Never drink and drive. Calcium supplementation 1000 to 1200 mg daily, ideally through your  diet.  Vitamin D supplementation 800 units daily.

## 2021-03-09 ENCOUNTER — Other Ambulatory Visit: Payer: Self-pay | Admitting: Nurse Practitioner

## 2021-03-09 DIAGNOSIS — F5101 Primary insomnia: Secondary | ICD-10-CM

## 2021-03-09 NOTE — Telephone Encounter (Signed)
Refill request Ambien  Last Annual 06/23/2020 with Marny Lowenstein NP Routing to provider for approval/denial

## 2021-03-15 ENCOUNTER — Emergency Department (HOSPITAL_COMMUNITY)
Admission: EM | Admit: 2021-03-15 | Discharge: 2021-03-15 | Disposition: A | Discharge status: Home / Self Care | Payer: BC Managed Care – PPO | Attending: Emergency Medicine | Admitting: Emergency Medicine

## 2021-03-15 ENCOUNTER — Emergency Department (HOSPITAL_COMMUNITY): Payer: BC Managed Care – PPO

## 2021-03-15 ENCOUNTER — Encounter (HOSPITAL_COMMUNITY): Payer: Self-pay | Admitting: Emergency Medicine

## 2021-03-15 ENCOUNTER — Ambulatory Visit
Admission: RE | Admit: 2021-03-15 | Discharge: 2021-03-15 | Disposition: A | Discharge status: Home / Self Care | Payer: BC Managed Care – PPO | Source: Ambulatory Visit | Attending: Nurse Practitioner | Admitting: Nurse Practitioner

## 2021-03-15 ENCOUNTER — Other Ambulatory Visit: Payer: Self-pay

## 2021-03-15 DIAGNOSIS — S5002XA Contusion of left elbow, initial encounter: Secondary | ICD-10-CM | POA: Diagnosis not present

## 2021-03-15 DIAGNOSIS — J45909 Unspecified asthma, uncomplicated: Secondary | ICD-10-CM | POA: Diagnosis not present

## 2021-03-15 DIAGNOSIS — S59902A Unspecified injury of left elbow, initial encounter: Secondary | ICD-10-CM | POA: Diagnosis not present

## 2021-03-15 DIAGNOSIS — W228XXA Striking against or struck by other objects, initial encounter: Secondary | ICD-10-CM | POA: Insufficient documentation

## 2021-03-15 DIAGNOSIS — Z1231 Encounter for screening mammogram for malignant neoplasm of breast: Secondary | ICD-10-CM | POA: Diagnosis not present

## 2021-03-15 NOTE — ED Triage Notes (Signed)
Patient here from home reporting hitting left elbow on stove. Pain, unable to bend.

## 2021-03-15 NOTE — ED Provider Notes (Signed)
[place] DEPT Provider Note   CSN: [id number] Arrival date & time: 03/15/21  1742     History Chief Complaint  Patient presents with   Elbow Pain    Jill Kennedy is a 55 y.o. female.  Pt fell an hit her left elbow on her stove.  Pt reports pain she   The history is provided by the patient. No language interpreter was used.  Extremity Pain This is a new problem. The current episode started 3 to 5 hours ago. The problem occurs constantly. The problem has been gradually worsening. Nothing aggravates the symptoms. Nothing relieves the symptoms. She has tried nothing for the symptoms. The treatment provided no relief.      Past Medical History:  Diagnosis Date   Asthma    as a child   Hemorrhoids    Menorrhagia    Post-operative nausea and vomiting     Patient Active Problem List   Diagnosis Date Noted   Hyperlipidemia 01/13/2020   Fatigue 05/06/2015   Arthralgia 04/29/2015   Depression    CONSTIPATION 04/26/2009   Anxiety disorder, unspecified 09/19/2007   HEADACHE 09/19/2007   CHEST PAIN UNSPECIFIED 09/19/2007    Past Surgical History:  Procedure Laterality Date   AUGMENTATION MAMMAPLASTY  1999   CERVICAL CERCLAGE  2000   TUBAL LIGATION       OB History     Gravida  3   Para  2   Term      Preterm      AB  1   Living  2      SAB      IAB      Ectopic      Multiple      Live Births              Family History  Problem Relation Age of Onset   Breast cancer Mother 57   Hypertension Mother    Hypertension Brother    Diabetes Brother    Breast cancer Maternal Aunt    Breast cancer Maternal Aunt    Breast cancer Maternal Aunt    Breast cancer Cousin    Breast cancer Cousin     Social History   Tobacco Use   Smoking status: Never   Smokeless tobacco: Never  Vaping Use   Vaping Use: Never used  Substance Use Topics   Alcohol use: Yes    Comment: SOCIALLY ONLY   Drug use: No    Home  Medications Prior to Admission medications   Medication Sig Start Date End Date Taking? Authorizing Provider  citalopram (CELEXA) 20 MG tablet Take 20 mg daily 06/23/20   Marny Lowenstein A, NP  Multiple Vitamins-Minerals (ONE-A-DAY 50 PLUS PO) Take by mouth daily at 6 (six) AM.    [provider]  triamcinolone cream (KENALOG) 0.1 % Apply 1 application topically 2 (two) times daily. 01/25/21   Lucretia Kern, DO  zolpidem (AMBIEN) 10 MG tablet TAKE 1 TABLET BY MOUTH AT BEDTIME IF NEEDED FOR SLEEP. 03/09/21   Princess Bruins, MD    Allergies    Patient has no known allergies.  Review of Systems   Review of Systems  All other systems reviewed and are negative.  Physical Exam Updated Vital Signs BP (!) 159/102 (BP Location: Right Arm)   Pulse 73   Temp 98.1 F (36.7 C) (Oral)   Resp 14   Ht '5\' 7"'$  (1.702 m)   Wt 68 kg  LMP 01/28/2015   SpO2 100%   BMI 23.49 kg/m   Physical Exam Vitals reviewed.  Musculoskeletal:        General: Swelling and tenderness present.     Comments: Tender left elbow,  no gross swelling  pain with range of motion nv and ns intact   Skin:    General: Skin is warm.  Neurological:     General: No focal deficit present.     Mental Status: She is alert.  Psychiatric:        Mood and Affect: Mood normal.    ED Results / Procedures / Treatments   Labs (all labs ordered are listed, but only abnormal results are displayed) Labs Reviewed - No data to display  EKG None  Radiology DG Elbow Complete Left  Result Date: 03/15/2021 CLINICAL DATA:  Injury, struck left elbow on the stove. Elbow pain with limited range of motion. EXAM: LEFT ELBOW - COMPLETE 3+ VIEW COMPARISON:  None. FINDINGS: There is no evidence of fracture, dislocation, or joint effusion. Normal alignment and joint spaces. There is no evidence of arthropathy or other focal bone abnormality. Soft tissues are unremarkable. IMPRESSION: No fracture or dislocation of the left elbow.  Electronically Signed   By: Keith Rake M.D.   On: 03/15/2021 18:50    Procedures Procedures   Medications Ordered in ED Medications - No data to display  ED Course  I have reviewed the triage vital signs and the nursing notes.  Pertinent labs & imaging results that were available during my care of the patient were reviewed by me and considered in my medical decision making (see chart for details).    MDM Rules/Calculators/A&P                           MDM:  Pt counseled on xrays.  Pt placed in a sling.  Pt advised to follow up with Dr. Mardelle Matte for recheck in 1 week if pain persist  Final Clinical Impression(s) / ED Diagnoses Final diagnoses:  Contusion of left elbow, initial encounter    Rx / DC Orders ED Discharge Orders     None     An After Visit Summary was printed and given to the patient.    Sidney Ace 03/15/21 2002    Hayden Rasmussen, MD 03/16/21 1153

## 2021-03-15 NOTE — ED Notes (Signed)
Patient left before getting sling and discharge papers.

## 2021-03-15 NOTE — Discharge Instructions (Addendum)
Ice to area.

## 2021-06-28 ENCOUNTER — Encounter: Payer: Self-pay | Admitting: Nurse Practitioner

## 2021-06-28 ENCOUNTER — Ambulatory Visit (INDEPENDENT_AMBULATORY_CARE_PROVIDER_SITE_OTHER): Payer: BC Managed Care – PPO | Admitting: Nurse Practitioner

## 2021-06-28 ENCOUNTER — Telehealth: Payer: Self-pay

## 2021-06-28 ENCOUNTER — Other Ambulatory Visit: Payer: Self-pay

## 2021-06-28 VITALS — BP 116/76 | Ht 67.0 in | Wt 151.0 lb

## 2021-06-28 DIAGNOSIS — Z01419 Encounter for gynecological examination (general) (routine) without abnormal findings: Secondary | ICD-10-CM | POA: Diagnosis not present

## 2021-06-28 DIAGNOSIS — Z9189 Other specified personal risk factors, not elsewhere classified: Secondary | ICD-10-CM

## 2021-06-28 DIAGNOSIS — F419 Anxiety disorder, unspecified: Secondary | ICD-10-CM | POA: Diagnosis not present

## 2021-06-28 DIAGNOSIS — Z780 Asymptomatic menopausal state: Secondary | ICD-10-CM | POA: Diagnosis not present

## 2021-06-28 DIAGNOSIS — Z9882 Breast implant status: Secondary | ICD-10-CM

## 2021-06-28 DIAGNOSIS — Z803 Family history of malignant neoplasm of breast: Secondary | ICD-10-CM

## 2021-06-28 MED ORDER — CITALOPRAM HYDROBROMIDE 20 MG PO TABS: 20.0000 mg | ORAL_TABLET | Freq: Every day | ORAL | 3 refills | Status: DC

## 2021-06-28 NOTE — Telephone Encounter (Signed)
Jill Gammon, NP  [facility] Triage  Please send referral for breast MRI due to family history of breast cancer, increased risk of breast cancer, and breast implants. Thank you.

## 2021-06-28 NOTE — Progress Notes (Signed)
Jill Kennedy July 28, 1966 [id number]   History:  55 y.o. [id number] presents for annual exam without GYN complaints. Postmenopausal - no HRT, no bleeding. Complains of fatigue that she feels is related to menopause. Normal pap and mammogram history. Was evaluated for PMB in 2018 with negative SHGM. Anxiety managed well on Celexa.   Gynecologic History Patient's last menstrual period was 01/28/2015.   Contraception/Family planning: post menopausal status Sexually active: No  Health Maintenance Last Pap: 06/23/2020. Results were: Normal, 5-year repeat Last mammogram: 03/15/2021. Results were: Normal Last colonoscopy: 2019. Results were: Normal, 10-year recall Last Dexa: Not indicated  Past medical history, past surgical history, family history and social history were all reviewed and documented in the EPIC chart. Boyfriend of 12 years. Owns laundromats. 2 sons age 75 and 17. Oldest graduating with business degree in 08-06-2023. Mother deceased from breast cancer, 3 maternal aunts and 2 maternal cousins with breast cancer history.  Patient BRCA negative.   ROS:  A ROS was performed and pertinent positives and negatives are included.  Exam:  Vitals:   06/28/21 1329  BP: 116/76  Weight: 151 lb (68.5 kg)  Height: '5\' 7"'  (1.702 m)    Body mass index is 23.65 kg/m.  General appearance:  Normal Thyroid:  Symmetrical, normal in size, without palpable masses or nodularity. Respiratory  Auscultation:  Clear without wheezing or rhonchi Cardiovascular  Auscultation:  Regular rate, without rubs, murmurs or gallops  Edema/varicosities:  Not grossly evident Abdominal  Soft,nontender, without masses, guarding or rebound.  Liver/spleen:  No organomegaly noted  Hernia:  None appreciated  Skin  Inspection:  Grossly normal   Breasts: Examined lying and sitting. Bilateral implants noted.  Right: Without masses, retractions, discharge or axillary adenopathy.   Left: Without masses, retractions,  discharge or axillary adenopathy. Genitourinary   Inguinal/mons:  Normal without inguinal adenopathy  External genitalia:  Normal appearing vulva with no masses, tenderness, or lesions  BUS/Urethra/Skene's glands:  Normal  Vagina:  Normal appearing with normal color and discharge, no lesions  Cervix:  Normal appearing without discharge or lesions  Uterus:  Normal in size, shape and contour.  Midline and mobile, nontender  Adnexa/parametria:     Rt: Normal in size, without masses or tenderness.   Lt: Normal in size, without masses or tenderness.  Anus and perineum: Normal  Digital rectal exam: Normal sphincter tone without palpated masses or tenderness  Patient informed chaperone available to be present for breast and pelvic exam. Patient has requested no chaperone to be present. Patient has been advised what will be completed during breast and pelvic exam.   Assessment/Plan:  55 y.o. [id number] for annual exam.   Well female exam with routine gynecological exam - Education provided on SBEs, importance of preventative screenings, current guidelines, high calcium diet, regular exercise, and multivitamin daily. Labs with PCP.   Anxiety - Plan: citalopram (CELEXA) 20 MG tablet daily. She is doing well on this and tried to wean in the past but was unable to tolerate.  Refill x1 year provided.  Postmenopausal - she experiences daily fatigue and thinks it is associated with menopause. Denies other menopausal symptoms. Had lab work done with PCP and all normal.   Increased risk of breast cancer - Mother deceased from breast cancer (diagnosed at age 32), 3 maternal aunts and 2 maternal cousins with breast cancer history.  Patient BRCA negative. Bilateral saline implants > 20 years. Lifetime breast cancer risk of 16.2%. Recommend breast MRI due to breast implants and  family history.   Screening for cervical cancer - Normal Pap history.  Will repeat at 5-year interval per guidelines.   Screening for  breast cancer - Normal mammogram history.  Continue annual screenings.  Normal breast exam today.  Screening for colon cancer - Normal colonoscopy in 2019.  She will follow-up with GI's recommended interval.  Follow-up in 1 year for annual.      Tamela Gammon [facility], 1:51 PM 06/28/2021

## 2021-06-28 NOTE — Telephone Encounter (Signed)
Order placed. Spoke with patient and informed her and provided her the Phone number for [place] so she can call and answer the screening questions and schedule.

## 2021-07-01 ENCOUNTER — Other Ambulatory Visit: Payer: Self-pay | Admitting: Obstetrics & Gynecology

## 2021-07-01 DIAGNOSIS — F5101 Primary insomnia: Secondary | ICD-10-CM

## 2021-07-04 NOTE — Telephone Encounter (Signed)
Last annual exam was on 06/28/21

## 2021-07-19 NOTE — Telephone Encounter (Signed)
MRI appt scheduled 08/05/21 at [facility]. Routed to [facility] to check PA requirement.

## 2021-07-25 ENCOUNTER — Other Ambulatory Visit: Payer: Self-pay

## 2021-07-25 ENCOUNTER — Encounter: Payer: Self-pay | Admitting: Nurse Practitioner

## 2021-07-25 ENCOUNTER — Ambulatory Visit (INDEPENDENT_AMBULATORY_CARE_PROVIDER_SITE_OTHER): Payer: BC Managed Care – PPO | Admitting: Nurse Practitioner

## 2021-07-25 VITALS — BP 116/76

## 2021-07-25 DIAGNOSIS — N6082 Other benign mammary dysplasias of left breast: Secondary | ICD-10-CM | POA: Diagnosis not present

## 2021-07-25 NOTE — Progress Notes (Signed)
° °  Acute Office Visit  Subjective:    Patient ID: Jill Kennedy, female    DOB: [birth year], 55 y.o.   MRN: [id number]   HPI 55 y.o. presents today for left breast lump that popped up 07/11/2021. Area is along lower part of areola, was red, elevated, and very tender to where she could not wear a bra. Area has almost completely resolved. Normal mammogram history with most recent mammogram 03/15/2021. Scheduled for breast MRI screening 08/05/2021 due to [place] lifetime breast cancer risk of 19.1%.    Review of Systems  Constitutional: Negative.   Skin:        Red, swollen, painful area on left areola. Mostly resolved.       Objective:    Physical Exam Constitutional:      Appearance: Normal appearance.  Chest:  Breasts:    Right: Normal.     Left: Skin change (Resolving area along lower areola. Superficial in nature. Non-infectious appearing) present. No swelling, bleeding, inverted nipple, mass, nipple discharge or tenderness.      BP 116/76    LMP 01/28/2015  Wt Readings from Last 3 Encounters:  06/28/21 151 lb (68.5 kg)  03/15/21 150 lb (68 kg)  01/28/21 148 lb 12.8 oz (67.5 kg)        Assessment & Plan:   Problem List Items Addressed This Visit   None Visit Diagnoses     Sebaceous cyst of skin of breast, left    -  Primary       Plan: Area mostly resolved at visit today. Likely a small cyst or boil just under the skin. She will continue to monitor.     Tamela Gammon DNP, 4:48 PM 07/25/2021

## 2021-08-02 ENCOUNTER — Telehealth: Payer: Self-pay | Admitting: Nurse Practitioner

## 2021-08-02 NOTE — Telephone Encounter (Signed)
[place] lifetime breast cancer risk of 19.1%. MRI recommended for lifetime risk of >20% but she should be offered an abbreviated breast MRI due to intermediate risk. Patient will be made aware of possible costs to determine if she wants to continue with screening.

## 2021-08-03 NOTE — Telephone Encounter (Signed)
Left message to call Bowden Boody, RN at [facility], [phone].  

## 2021-08-04 NOTE — Telephone Encounter (Signed)
I received secure chat message this morning regarding this patient. "Good morning, this patient is scheduled with [facility] tomorrow for a MRI Breast WWO and her [employer] plan requires an authorization.  We will need today by 2pm today or we will have to cancel appointment.  Thank you for your assistance."  Also, Anderson Malta said she had a voice mail message from [place] [facility].

## 2021-08-04 NOTE — Telephone Encounter (Signed)
Per Drusilla Kanner at [facility], they were able to obtain authorization for the patient's scheduled MRI.

## 2021-08-05 ENCOUNTER — Other Ambulatory Visit: Payer: Self-pay

## 2021-08-05 ENCOUNTER — Ambulatory Visit
Admission: RE | Admit: 2021-08-05 | Discharge: 2021-08-05 | Disposition: A | Discharge status: Home / Self Care | Payer: BC Managed Care – PPO | Source: Ambulatory Visit | Attending: Nurse Practitioner | Admitting: Nurse Practitioner

## 2021-08-05 DIAGNOSIS — Z803 Family history of malignant neoplasm of breast: Secondary | ICD-10-CM

## 2021-08-05 DIAGNOSIS — Z9189 Other specified personal risk factors, not elsewhere classified: Secondary | ICD-10-CM

## 2021-08-05 DIAGNOSIS — Z9882 Breast implant status: Secondary | ICD-10-CM

## 2021-08-05 MED ORDER — GADOBUTROL 1 MMOL/ML IV SOLN
7.0000 mL | Freq: Once | INTRAVENOUS | Status: AC | PRN
  Administered 2021-08-05: 7 mL via INTRAVENOUS

## 2021-11-01 ENCOUNTER — Other Ambulatory Visit: Payer: Self-pay | Admitting: Obstetrics & Gynecology

## 2021-11-01 DIAGNOSIS — F5101 Primary insomnia: Secondary | ICD-10-CM

## 2021-11-01 NOTE — Telephone Encounter (Signed)
Last AEX 06/28/21.  ?

## 2022-02-24 ENCOUNTER — Other Ambulatory Visit: Payer: Self-pay | Admitting: Nurse Practitioner

## 2022-02-24 DIAGNOSIS — F5101 Primary insomnia: Secondary | ICD-10-CM

## 2022-02-24 NOTE — Telephone Encounter (Signed)
Last annual exam was 06/2021

## 2022-04-28 ENCOUNTER — Other Ambulatory Visit: Payer: Self-pay | Admitting: Nurse Practitioner

## 2022-04-28 ENCOUNTER — Telehealth: Payer: Self-pay | Admitting: *Deleted

## 2022-04-28 DIAGNOSIS — N644 Mastodynia: Secondary | ICD-10-CM

## 2022-04-28 NOTE — Telephone Encounter (Signed)
Call transferred from front office.  Patient upset. States she received a call from [facility] stating that she had abnormal finding from her breast MRI 07/2021 and is overdue for diagnostic imaging.   Reviewed 08/05/22 Breast MRI with patient and MyChart recommendations per Jonelle Sidle, NP.   Patient reports no changes in her left breast that she has noticed. Would like to keep Dx MMG scheduled for 05/08/22 for piece of mind at this point. Advised patient I will forward to Tiffany, NP to review when she returns to the office on 05/01/22. I will return call if any additional recommendations made. Patient verbalizes understanding and appreciative of call.    Routing to [employer] for final review.

## 2022-05-01 NOTE — Telephone Encounter (Signed)
Spoke with patient. Advised per Jonelle Sidle, NP. Patient verbalizes understanding and is agreeable.   Encounter closed.

## 2022-05-01 NOTE — Telephone Encounter (Signed)
Yes, I agree with keeping diagnostic imaging on 10/2. She should still be having annual mammograms along with annual MRIs.

## 2022-05-08 ENCOUNTER — Ambulatory Visit: Payer: BC Managed Care – PPO

## 2022-05-08 ENCOUNTER — Ambulatory Visit
Admission: RE | Admit: 2022-05-08 | Discharge: 2022-05-08 | Disposition: A | Discharge status: Home / Self Care | Payer: BC Managed Care – PPO | Source: Ambulatory Visit | Attending: Nurse Practitioner | Admitting: Nurse Practitioner

## 2022-05-08 DIAGNOSIS — R928 Other abnormal and inconclusive findings on diagnostic imaging of breast: Secondary | ICD-10-CM | POA: Diagnosis not present

## 2022-05-08 DIAGNOSIS — N644 Mastodynia: Secondary | ICD-10-CM

## 2022-05-31 DIAGNOSIS — H5203 Hypermetropia, bilateral: Secondary | ICD-10-CM | POA: Diagnosis not present

## 2022-05-31 DIAGNOSIS — H524 Presbyopia: Secondary | ICD-10-CM | POA: Diagnosis not present

## 2022-05-31 DIAGNOSIS — H2513 Age-related nuclear cataract, bilateral: Secondary | ICD-10-CM | POA: Diagnosis not present

## 2022-06-13 ENCOUNTER — Other Ambulatory Visit: Payer: Self-pay

## 2022-06-13 DIAGNOSIS — F5101 Primary insomnia: Secondary | ICD-10-CM

## 2022-06-13 NOTE — Telephone Encounter (Signed)
Medication refill request: Lorrin Mais '10mg'$  Last AEX:  06-28-21 Next AEX: 07-04-22 Last MMG (if hormonal medication request): n/a Refill authorized: please approve if appropriate

## 2022-06-14 MED ORDER — ZOLPIDEM TARTRATE 10 MG PO TABS: ORAL_TABLET | ORAL | 0 refills | Status: DC

## 2022-07-04 ENCOUNTER — Ambulatory Visit (INDEPENDENT_AMBULATORY_CARE_PROVIDER_SITE_OTHER): Payer: BC Managed Care – PPO | Admitting: Nurse Practitioner

## 2022-07-04 ENCOUNTER — Encounter: Payer: Self-pay | Admitting: Nurse Practitioner

## 2022-07-04 VITALS — BP 118/78 | HR 76 | Ht 66.25 in | Wt 151.0 lb

## 2022-07-04 DIAGNOSIS — Z113 Encounter for screening for infections with a predominantly sexual mode of transmission: Secondary | ICD-10-CM | POA: Diagnosis not present

## 2022-07-04 DIAGNOSIS — F411 Generalized anxiety disorder: Secondary | ICD-10-CM | POA: Diagnosis not present

## 2022-07-04 DIAGNOSIS — Z780 Asymptomatic menopausal state: Secondary | ICD-10-CM

## 2022-07-04 DIAGNOSIS — Z01419 Encounter for gynecological examination (general) (routine) without abnormal findings: Secondary | ICD-10-CM | POA: Diagnosis not present

## 2022-07-04 MED ORDER — CITALOPRAM HYDROBROMIDE 20 MG PO TABS: 20.0000 mg | ORAL_TABLET | Freq: Every day | ORAL | 3 refills | Status: DC

## 2022-07-04 NOTE — Progress Notes (Signed)
Jill Kennedy 1966-06-30 [id number]   History:  56 y.o. [id number] presents for annual exam without GYN complaints. Postmenopausal - no HRT, no bleeding. Normal pap and mammogram history. Anxiety managed well on Celexa.   Gynecologic History Patient's last menstrual period was 01/28/2015.   Contraception/Family planning: post menopausal status Sexually active: Yes, would like STD screening  Health Maintenance Last Pap: 06/23/2020. Results were: Normal, 5-year repeat Last mammogram: 05/08/2022. Results were: Normal Last colonoscopy: 11/07/2017. Results were: Normal, 10-year recall Last Dexa: Not indicated  Past medical history, past surgical history, family history and social history were all reviewed and documented in the EPIC chart. Boyfriend of 13 years. Owns laundromats. 2 sons age 72 and 28. Mother deceased from breast cancer, 3 maternal aunts and 2 maternal cousins with breast cancer history.  Patient BRCA negative.   ROS:  A ROS was performed and pertinent positives and negatives are included.  Exam:  Vitals:   07/04/22 1407  BP: 118/78  Pulse: 76  SpO2: 99%  Weight: 151 lb (68.5 kg)  Height: 5' 6.25" (1.683 m)     Body mass index is 24.19 kg/m.  General appearance:  Normal Thyroid:  Symmetrical, normal in size, without palpable masses or nodularity. Respiratory  Auscultation:  Clear without wheezing or rhonchi Cardiovascular  Auscultation:  Regular rate, without rubs, murmurs or gallops  Edema/varicosities:  Not grossly evident Abdominal  Soft,nontender, without masses, guarding or rebound.  Liver/spleen:  No organomegaly noted  Hernia:  None appreciated  Skin  Inspection:  Grossly normal   Breasts: Examined lying and sitting. Bilateral implants noted.  Right: Without masses, retractions, discharge or axillary adenopathy.   Left: Without masses, retractions, discharge or axillary adenopathy. Genitourinary   Inguinal/mons:  Normal without inguinal  adenopathy  External genitalia:  Normal appearing vulva with no masses, tenderness, or lesions  BUS/Urethra/Skene's glands:  Normal  Vagina:  Normal appearing with normal color and discharge, no lesions  Cervix:  Normal appearing without discharge or lesions  Uterus:  Normal in size, shape and contour.  Midline and mobile, nontender  Adnexa/parametria:     Rt: Normal in size, without masses or tenderness.   Lt: Normal in size, without masses or tenderness.  Anus and perineum: Normal  Digital rectal exam: Normal sphincter tone without palpated masses or tenderness  Patient informed chaperone available to be present for breast and pelvic exam. Patient has requested no chaperone to be present. Patient has been advised what will be completed during breast and pelvic exam.   Assessment/Plan:  56 y.o. [id number] for annual exam.   Well female exam with routine gynecological exam - Education provided on SBEs, importance of preventative screenings, current guidelines, high calcium diet, regular exercise, and multivitamin daily. Labs with PCP.   Postmenopausal - no HRT, no bleeding  Generalized anxiety disorder - Plan: citalopram (CELEXA) 20 MG tablet daily. Doing well on this and wants to continue. Refill x 1 year provided.   Screening examination for STD (sexually transmitted disease) - Plan: RPR, HIV Antibody (routine testing w rflx), SURESWAB CT/NG/T. vaginalis  Screening for cervical cancer - Normal Pap history.  Will repeat at 5-year interval per guidelines.   Screening for breast cancer - Normal mammogram history.  Continue annual screenings.  Normal breast exam today. Family history of breast cancer.   Screening for colon cancer - Normal colonoscopy in 2019.  She will follow-up with GI's recommended interval.  Follow-up in 1 year for annual.  Tamela Gammon [facility], 2:19 PM 07/04/2022

## 2022-07-05 LAB — RPR: RPR Ser Ql: NONREACTIVE

## 2022-07-05 LAB — SURESWAB CT/NG/T. VAGINALIS
C. trachomatis RNA, TMA: NOT DETECTED
N. gonorrhoeae RNA, TMA: NOT DETECTED
Trichomonas vaginalis RNA: NOT DETECTED

## 2022-07-05 LAB — HIV ANTIBODY (ROUTINE TESTING W REFLEX): HIV 1&2 Ab, 4th Generation: NONREACTIVE

## 2022-09-26 NOTE — Progress Notes (Unsigned)
ACUTE VISIT Chief Complaint  Patient presents with   Nevus    On leg, getting darker, larger & raised.    HPI: JillJill Kennedy is a 57 y.o. female, who is here today concerned  about changing "mole." Skin lesion she felt behind left thigh and seems to be growing. Skin lesion has been present for several years, but has been slowly getting larger and darker, with a raised appearance. She denies any associated pruritus, pain, or bleeding.  Negative for personal or family Hx of skin cancer, but  mentions that cancer runs in her family.  She has not tried OTC medications. Negative for fever,abnormal wt loss,or night sweats.  She wants to have a breast examination today. Mother with Hx of breast cancer. She has not noted masses, skin changes,or nipple discharge. Last mammogram 05/2022 Bi-Rads 1.  HLD on non pharmacologic treatment. She had labs done recently at her gyn's office and thought FLP was done. Lab Results  Component Value Date   CHOL 212 (H) 01/28/2021   HDL 67.30 01/28/2021   LDLCALC 129 (H) 01/28/2021   TRIG 76.0 01/28/2021   CHOLHDL 3 01/28/2021   No hx of diabetes. HgA1C has been slightly elevated. Negative for polydipsia,polyuria, or polyphagia.  Lab Results  Component Value Date   HGBA1C 5.7 01/28/2021   Review of Systems  HENT:  Negative for mouth sores and sore throat.   Respiratory:  Negative for cough, shortness of breath and wheezing.   Gastrointestinal:  Negative for abdominal pain, nausea and vomiting.  Endocrine: Negative for cold intolerance and heat intolerance.  Musculoskeletal:  Negative for gait problem and myalgias.  Hematological:  Negative for adenopathy. Does not bruise/bleed easily.  Psychiatric/Behavioral:  The patient is nervous/anxious.   See other pertinent positives and negatives in HPI.  Current Outpatient Medications on File Prior to Visit  Medication Sig Dispense Refill   citalopram (CELEXA) 20 MG tablet Take 1 tablet (20 mg  total) by mouth daily. Take 20 mg daily 90 tablet 3   Multiple Vitamins-Minerals (ONE-A-DAY 50 PLUS PO) Take by mouth daily at 6 (six) AM.     zolpidem (AMBIEN) 10 MG tablet Take 1 tablet by mouth at bedtime if needed for sleep 30 tablet 0   No current facility-administered medications on file prior to visit.   Past Medical History:  Diagnosis Date   Asthma    as a child   Hemorrhoids    Menorrhagia    Post-operative nausea and vomiting    No Known Allergies  Social History   Socioeconomic History   Marital status: Divorced    Spouse name: Not on file   Number of children: Not on file   Years of education: Not on file   Highest education level: Not on file  Occupational History    Employer: SELF EMPLOYED  Tobacco Use   Smoking status: Never   Smokeless tobacco: Never  Vaping Use   Vaping Use: Never used  Substance and Sexual Activity   Alcohol use: Yes    Comment: SOCIALLY ONLY   Drug use: No   Sexual activity: Yes    Partners: Male    Birth control/protection: Surgical    Comment: PM/BTL  Other Topics Concern   Not on file  Social History Narrative   Not on file   Social Determinants of Health   Financial Resource Strain: Not on file  Food Insecurity: Not on file  Transportation Needs: Not on file  Physical Activity: Not on file  Stress: Not on file  Social Connections: Not on file   Vitals:   09/27/22 1429  BP: 120/60  Pulse: 73  Resp: 12  Temp: 98.4 F (36.9 C)  SpO2: 98%   Body mass index is 24.41 kg/m.  Physical Exam Vitals and nursing note reviewed.  Constitutional:      General: She is not in acute distress.    Appearance: She is well-developed.  HENT:     Head: Normocephalic and atraumatic.  Eyes:     Conjunctiva/sclera: Conjunctivae normal.  Pulmonary:     Effort: Pulmonary effort is normal. No respiratory distress.     Breath sounds: Normal breath sounds.  Chest:  Breasts:    Right: Inverted nipple present. No mass, nipple  discharge, skin change or tenderness.     Left: Inverted nipple present. No mass, nipple discharge, skin change or tenderness.     Comments: Bilateral breast implants seem to be intact. Lymphadenopathy:     Cervical: No cervical adenopathy.     Upper Body:     Right upper body: No axillary adenopathy.     Left upper body: No axillary adenopathy.  Skin:    General: Skin is warm.     Findings: No rash.  Neurological:     Mental Status: She is alert and oriented to person, place, and time.  Psychiatric:        Mood and Affect: Affect normal. Mood is anxious.   ASSESSMENT AND PLAN:  Ms. Winns was seen today for mole changes.  Hyperlipidemia, unspecified hyperlipidemia type Assessment & Plan: Continue nonpharmacologic treatment. She is not fasting today, future lab order placed. Further recommendations will be given according to lipid panel numbers and 10-year CVD score.  Orders: -     Lipid panel; Future  Seborrheic keratosis Assessment & Plan: Behind left thigh. Continue monitoring for changes. Reassure.  Educated about ABCDE rules for melanoma/skin malignancy. Stressed the importance of skin protection from UV light with sun screening and mechanical protection.   Hemangioma of skin Assessment & Plan: Small scattered on back and abdomen. Educated about Dx, prognosis,and treatment options.   Prediabetes Assessment & Plan: HgA1C minimally elevated. Continue a healthy life style for diabetes prevention.  Orders: -     Hemoglobin A1c; Future  Breast examination today negative for suspicious findings. Hx of inverted nipples. Continue following with her gynecologist.  Return in about 1 year (around 09/28/2023), or if symptoms worsen or fail to improve.  Orianna Biskup G. [place], MD  [facility]. [place] office.

## 2022-09-27 ENCOUNTER — Encounter: Payer: Self-pay | Admitting: Family Medicine

## 2022-09-27 ENCOUNTER — Ambulatory Visit (INDEPENDENT_AMBULATORY_CARE_PROVIDER_SITE_OTHER): Payer: Self-pay | Admitting: Family Medicine

## 2022-09-27 VITALS — BP 120/60 | HR 73 | Temp 98.4°F | Resp 12 | Ht 66.25 in | Wt 152.4 lb

## 2022-09-27 DIAGNOSIS — R7303 Prediabetes: Secondary | ICD-10-CM | POA: Insufficient documentation

## 2022-09-27 DIAGNOSIS — L821 Other seborrheic keratosis: Secondary | ICD-10-CM | POA: Insufficient documentation

## 2022-09-27 DIAGNOSIS — E785 Hyperlipidemia, unspecified: Secondary | ICD-10-CM

## 2022-09-27 DIAGNOSIS — D1801 Hemangioma of skin and subcutaneous tissue: Secondary | ICD-10-CM | POA: Insufficient documentation

## 2022-09-27 NOTE — Patient Instructions (Addendum)
A few things to remember from today's visit:  Hyperlipidemia, unspecified hyperlipidemia type - Plan: Lipid panel  Seborrheic keratosis  Hemangioma of skin  Prediabetes - Plan: Hemoglobin A1c  No suspicious lesions. Lesion of concern is benign. Monitor for changes. You also have some cherry angiomas,also benign.  Do not use My Chart to request refills or for acute issues that need immediate attention. If you send a my chart message, it may take a few days to be addressed, specially if I am not in the office.  Please be sure medication list is accurate. If a new problem present, please set up appointment sooner than planned today.

## 2022-09-28 NOTE — Assessment & Plan Note (Signed)
Behind left thigh. Continue monitoring for changes. Reassure.  Educated about ABCDE rules for melanoma/skin malignancy. Stressed the importance of skin protection from UV light with sun screening and mechanical protection.

## 2022-09-28 NOTE — Assessment & Plan Note (Signed)
Continue nonpharmacologic treatment. She is not fasting today, future lab order placed. Further recommendations will be given according to lipid panel numbers and 10-year CVD score.

## 2022-09-28 NOTE — Assessment & Plan Note (Signed)
Small scattered on back and abdomen. Educated about Dx, prognosis,and treatment options.

## 2022-09-28 NOTE — Assessment & Plan Note (Signed)
HgA1C minimally elevated. Continue a healthy life style for diabetes prevention.

## 2022-10-04 ENCOUNTER — Other Ambulatory Visit: Payer: Self-pay

## 2022-10-26 ENCOUNTER — Other Ambulatory Visit: Payer: Self-pay | Admitting: Nurse Practitioner

## 2022-10-26 DIAGNOSIS — F5101 Primary insomnia: Secondary | ICD-10-CM

## 2022-10-26 NOTE — Telephone Encounter (Signed)
AEX 07/04/22

## 2023-02-03 ENCOUNTER — Other Ambulatory Visit: Payer: Self-pay | Admitting: Nurse Practitioner

## 2023-02-03 DIAGNOSIS — F5101 Primary insomnia: Secondary | ICD-10-CM

## 2023-02-05 NOTE — Telephone Encounter (Signed)
Medication refill request: ambien 10mg  Last AEX:  07-04-22 Next AEX: not scheduled Last MMG (if hormonal medication request): n/a Refill authorized: please approve if appropriate

## 2023-02-13 ENCOUNTER — Encounter: Payer: Self-pay | Admitting: Internal Medicine

## 2023-02-13 ENCOUNTER — Ambulatory Visit (INDEPENDENT_AMBULATORY_CARE_PROVIDER_SITE_OTHER): Payer: Self-pay | Admitting: Internal Medicine

## 2023-02-13 VITALS — BP 135/88 | HR 70 | Temp 98.4°F | Wt 147.0 lb

## 2023-02-13 DIAGNOSIS — R030 Elevated blood-pressure reading, without diagnosis of hypertension: Secondary | ICD-10-CM

## 2023-02-13 DIAGNOSIS — E785 Hyperlipidemia, unspecified: Secondary | ICD-10-CM

## 2023-02-13 LAB — LIPID PANEL
Cholesterol: 243 mg/dL — ABNORMAL HIGH (ref 0–200)
HDL: 63.7 mg/dL (ref >39.00)
LDL Cholesterol: 159 mg/dL — ABNORMAL HIGH (ref 0–99)
NonHDL: 179.31
Total CHOL/HDL Ratio: 4
Triglycerides: 101 mg/dL (ref 0.0–149.0)
VLDL: 20.2 mg/dL (ref 0.0–40.0)

## 2023-02-13 NOTE — Progress Notes (Signed)
Established Patient Office Visit     CC/Reason for Visit: Elevated blood pressure, requesting labs  HPI: Jill Kennedy is a 57 y.o. female who is coming in today for the above mentioned reasons.  Over the she checked her blood pressure and it was elevated into the 140s systolic, she does not remember the diastolic blood pressure.  She states that over the past 2 days every time she has checked it has been in that range.  She is not known to be hypertensive.  She is requesting to have a lipid panel today.   Past Medical/Surgical History: Past Medical History:  Diagnosis Date   Asthma    as a child   Hemorrhoids    Menorrhagia    Post-operative nausea and vomiting     Past Surgical History:  Procedure Laterality Date   AUGMENTATION MAMMAPLASTY  1999   CERVICAL CERCLAGE  2000   TUBAL LIGATION      Social History:  reports that she has never smoked. She has never used smokeless tobacco. She reports current alcohol use. She reports that she does not use drugs.  Allergies: No Known Allergies  Family History:  Family History  Problem Relation Age of Onset   Breast cancer Mother 73   Hypertension Mother    Breast cancer Maternal Aunt    Breast cancer Maternal Aunt    Breast cancer Maternal Aunt    Breast cancer Cousin        maternal   Breast cancer Cousin        maternal   Hypertension Brother    Diabetes Brother      Current Outpatient Medications:    citalopram (CELEXA) 20 MG tablet, Take 1 tablet (20 mg total) by mouth daily. Take 20 mg daily, Disp: 90 tablet, Rfl: 3   Multiple Vitamins-Minerals (ONE-A-DAY 50 PLUS PO), Take by mouth daily at 6 (six) AM., Disp: , Rfl:    zolpidem (AMBIEN) 10 MG tablet, TAKE ONE TABLET BY MOUTH AT BEDTIME AS NEEDED FOR SLEEP, Disp: 30 tablet, Rfl: 0  Review of Systems:  Negative unless indicated in HPI.   Physical Exam: Vitals:   02/13/23 1330  BP: 135/88  Pulse: 70  Temp: 98.4 F (36.9 C)  TempSrc: Oral  SpO2:  100%  Weight: 147 lb (66.7 kg)    Body mass index is 23.55 kg/m.   Physical Exam Vitals reviewed.  Constitutional:      Appearance: Normal appearance.  HENT:     Head: Normocephalic and atraumatic.  Eyes:     Conjunctiva/sclera: Conjunctivae normal.     Pupils: Pupils are equal, round, and reactive to light.  Cardiovascular:     Rate and Rhythm: Normal rate and regular rhythm.  Pulmonary:     Effort: Pulmonary effort is normal.     Breath sounds: Normal breath sounds.  Skin:    General: Skin is warm and dry.  Neurological:     General: No focal deficit present.     Mental Status: She is alert and oriented to person, place, and time.  Psychiatric:        Mood and Affect: Mood normal.        Behavior: Behavior normal.        Thought Content: Thought content normal.        Judgment: Judgment normal.      Impression and Plan:  Elevated BP without diagnosis of hypertension  Hyperlipidemia, unspecified hyperlipidemia type -  Lipid panel; Future   -Advised to do ambulatory blood pressure measurements and return in 4 to 6 weeks with PCP to discuss.  Have advised that we cannot make a diagnosis of hypertension  based off just a couple elevated measurements. -Lipid panel requested.  Time spent:23 minutes reviewing chart, interviewing and examining patient and formulating plan of care.     Chaya Jan, MD [facility]

## 2023-03-29 ENCOUNTER — Telehealth: Payer: Self-pay

## 2023-03-29 NOTE — Telephone Encounter (Signed)
Patient left a message in triage voicemail. Message states patient saw a chiropractor & they did an xray & said they saw a cyst in her lower pelvic area & for her to have follow up. She said she isn't sure if she needs to see [place] or her pcp.

## 2023-03-30 NOTE — Telephone Encounter (Signed)
LVMTCB

## 2023-03-30 NOTE — Progress Notes (Unsigned)
   ACUTE VISIT No chief complaint on file.  HPI: [name] is a 57 y.o. female, who is here today complaining of *** HPI  Review of Systems See other pertinent positives and negatives in HPI.  Current Outpatient Medications on File Prior to Visit  Medication Sig Dispense Refill   citalopram (CELEXA) 20 MG tablet Take 1 tablet (20 mg total) by mouth daily. Take 20 mg daily 90 tablet 3   Multiple Vitamins-Minerals (ONE-A-DAY 50 PLUS PO) Take by mouth daily at 6 (six) AM.     zolpidem (AMBIEN) 10 MG tablet TAKE ONE TABLET BY MOUTH AT BEDTIME AS NEEDED FOR SLEEP 30 tablet 0   No current facility-administered medications on file prior to visit.    Past Medical History:  Diagnosis Date   Asthma    as a child   Hemorrhoids    Menorrhagia    Post-operative nausea and vomiting    No Known Allergies  Social History   Socioeconomic History   Marital status: Divorced    Spouse name: Not on file   Number of children: Not on file   Years of education: Not on file   Highest education level: Not on file  Occupational History    Employer: SELF EMPLOYED  Tobacco Use   Smoking status: Never   Smokeless tobacco: Never  Vaping Use   Vaping status: Never Used  Substance and Sexual Activity   Alcohol use: Yes    Comment: SOCIALLY ONLY   Drug use: No   Sexual activity: Yes    Partners: Male    Birth control/protection: Surgical    Comment: PM/BTL  Other Topics Concern   Not on file  Social History Narrative   Not on file   Social Determinants of Health   Financial Resource Strain: Not on file  Food Insecurity: Not on file  Transportation Needs: Not on file  Physical Activity: Not on file  Stress: Not on file  Social Connections: Not on file    There were no vitals filed for this visit. There is no height or weight on file to calculate BMI.  Physical Exam  ASSESSMENT AND PLAN: There are no diagnoses linked to this encounter.  No follow-ups on file.  Jill Kennedy  G. [place], MD  [facility]. [place] office.  Discharge Instructions   None

## 2023-04-02 ENCOUNTER — Ambulatory Visit (INDEPENDENT_AMBULATORY_CARE_PROVIDER_SITE_OTHER): Payer: Self-pay | Admitting: Family Medicine

## 2023-04-02 ENCOUNTER — Encounter: Payer: Self-pay | Admitting: Family Medicine

## 2023-04-02 VITALS — BP 118/78 | HR 70 | Temp 98.7°F | Resp 12 | Ht 66.25 in | Wt 152.0 lb

## 2023-04-02 DIAGNOSIS — R3915 Urgency of urination: Secondary | ICD-10-CM

## 2023-04-02 DIAGNOSIS — R1900 Intra-abdominal and pelvic swelling, mass and lump, unspecified site: Secondary | ICD-10-CM

## 2023-04-02 DIAGNOSIS — K5900 Constipation, unspecified: Secondary | ICD-10-CM

## 2023-04-02 NOTE — Telephone Encounter (Signed)
Per review of EPIC, patient is scheduled to see PCP today, 04/02/23 at 1230.   No results in EPIC.   Routing to [employer]. Ok to close encounter?

## 2023-04-02 NOTE — Telephone Encounter (Signed)
Yes, OK to close. Thanks.  

## 2023-04-02 NOTE — Telephone Encounter (Signed)
Encounter closed

## 2023-04-02 NOTE — Patient Instructions (Addendum)
A few things to remember from today's visit:  Pelvic mass in female - Plan: US PELVIC COMPLETE WITH TRANSVAGINAL  Constipation, unspecified constipation type  Urinary urgency - Plan: Urinalysis, Routine w reflex microscopic  Pelvic ultrasound will be arranged. Try MiraLAX every other day at bedtime. Continue adequate fiber and fluid intake.  If you need refills for medications you take chronically, please call your pharmacy. Do not use My Chart to request refills or for acute issues that need immediate attention. If you send a my chart message, it may take a few days to be addressed, specially if I am not in the office.  Please be sure medication list is accurate. If a new problem present, please set up appointment sooner than planned today.

## 2023-04-03 LAB — URINALYSIS, ROUTINE W REFLEX MICROSCOPIC
Bilirubin Urine: NEGATIVE
Hgb urine dipstick: NEGATIVE
Ketones, ur: NEGATIVE
Leukocytes,Ua: NEGATIVE
Nitrite: NEGATIVE
Specific Gravity, Urine: 1.02 (ref 1.000–1.030)
Total Protein, Urine: NEGATIVE
Urine Glucose: NEGATIVE
Urobilinogen, UA: 0.2 (ref 0.0–1.0)
pH: 6 (ref 5.0–8.0)

## 2023-04-05 ENCOUNTER — Telehealth: Payer: Self-pay | Admitting: Family Medicine

## 2023-04-05 NOTE — Telephone Encounter (Signed)
Pt requesting to speak with someone regarding lab results

## 2023-04-06 ENCOUNTER — Ambulatory Visit
Admission: RE | Admit: 2023-04-06 | Discharge: 2023-04-06 | Disposition: A | Discharge status: Home / Self Care | Payer: Self-pay | Source: Ambulatory Visit | Attending: Family Medicine | Admitting: Family Medicine

## 2023-04-06 DIAGNOSIS — R1900 Intra-abdominal and pelvic swelling, mass and lump, unspecified site: Secondary | ICD-10-CM

## 2023-04-06 NOTE — Telephone Encounter (Signed)
Results haven't been reviewed by PCP yet.

## 2023-04-24 ENCOUNTER — Other Ambulatory Visit: Payer: Self-pay | Admitting: Nurse Practitioner

## 2023-04-24 DIAGNOSIS — F5101 Primary insomnia: Secondary | ICD-10-CM

## 2023-04-24 NOTE — Telephone Encounter (Signed)
Med refill request: Ambien Last AEX: 07/04/22 Next AEX: 08/07/23 Last MMG (if hormonal med) 05/08/22 Refill authorized: Please Advise, #30, 0 RF

## 2023-06-28 ENCOUNTER — Other Ambulatory Visit: Payer: Self-pay | Admitting: Nurse Practitioner

## 2023-06-28 DIAGNOSIS — F5101 Primary insomnia: Secondary | ICD-10-CM

## 2023-06-28 NOTE — Telephone Encounter (Signed)
Med refill request: Ambien Last AEX: 07/04/22 Next AEX: 08/07/23 Last MMG (if hormonal med) 05/08/22 Refill authorized: Please Advise, #30, 0 RF

## 2023-08-07 ENCOUNTER — Ambulatory Visit (INDEPENDENT_AMBULATORY_CARE_PROVIDER_SITE_OTHER): Payer: Self-pay | Admitting: Nurse Practitioner

## 2023-08-07 ENCOUNTER — Encounter: Payer: Self-pay | Admitting: Nurse Practitioner

## 2023-08-07 VITALS — BP 110/72 | HR 72 | Ht 66.5 in | Wt 153.0 lb

## 2023-08-07 DIAGNOSIS — Z01419 Encounter for gynecological examination (general) (routine) without abnormal findings: Secondary | ICD-10-CM

## 2023-08-07 DIAGNOSIS — Z780 Asymptomatic menopausal state: Secondary | ICD-10-CM

## 2023-08-07 DIAGNOSIS — F411 Generalized anxiety disorder: Secondary | ICD-10-CM

## 2023-08-07 MED ORDER — CITALOPRAM HYDROBROMIDE 20 MG PO TABS: 20.0000 mg | ORAL_TABLET | Freq: Every day | ORAL | 3 refills | Status: DC

## 2023-08-07 NOTE — Progress Notes (Signed)
 Jill Kennedy Jul 09, 1966 [id number]   History:  57 y.o. H6E9987 presents for annual exam without GYN complaints. Postmenopausal - no HRT, no bleeding. Normal pap and mammogram history. Anxiety managed well on Celexa .   Gynecologic History Patient's last menstrual period was 01/28/2015.   Contraception/Family planning: post menopausal status Sexually active: Yes  Health Maintenance Last Pap: 06/23/2020. Results were: Normal neg HPV Last mammogram: 05/08/2022. Results were: Normal Last colonoscopy: 11/07/2017. Results were: Normal, 10-year recall Last Dexa: Not indicated  Past medical history, past surgical history, family history and social history were all reviewed and documented in the EPIC chart. Boyfriend of 14 years. 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 16 yo son, moved to [place] with girlfriend. Mother deceased from breast cancer, 3 maternal aunts and 2 maternal cousins with breast cancer history.  Patient BRCA negative.   ROS:  A ROS was performed and pertinent positives and negatives are included.  Exam:  Vitals:   08/07/23 1456  BP: 110/72  Pulse: 72  SpO2: 98%  Weight: 153 lb (69.4 kg)  Height: 5' 6.5 (1.689 m)      Body mass index is 24.32 kg/m.  General appearance:  Normal Thyroid :  Symmetrical, normal in size, without palpable masses or nodularity. Respiratory  Auscultation:  Clear without wheezing or rhonchi Cardiovascular  Auscultation:  Regular rate, without rubs, murmurs or gallops  Edema/varicosities:  Not grossly evident Abdominal  Soft,nontender, without masses, guarding or rebound.  Liver/spleen:  No organomegaly noted  Hernia:  None appreciated  Skin  Inspection:  Grossly normal   Breasts: Examined lying and sitting. Bilateral implants noted.  Right: Without masses, retractions, discharge or axillary adenopathy.   Left: Without masses, retractions, discharge or axillary adenopathy. Pelvic: External  genitalia:  no lesions              Urethra:  normal appearing urethra with no masses, tenderness or lesions              Bartholins and Skenes: normal                 Vagina: normal appearing vagina with normal color and discharge, no lesions              Cervix: no lesions Bimanual Exam:  Uterus:  no masses or tenderness              Adnexa: no mass, fullness, tenderness              Rectovaginal: Deferred              Anus:  normal, no lesions  Patient informed chaperone available to be present for breast and pelvic exam. Patient has requested no chaperone to be present. Patient has been advised what will be completed during breast and pelvic exam.   Assessment/Plan:  57 y.o. [id number] for annual exam.   Well female exam with routine gynecological exam - Education provided on SBEs, importance of preventative screenings, current guidelines, high calcium diet, regular exercise, and multivitamin daily. Labs with PCP.   Postmenopausal - no HRT, no bleeding  Generalized anxiety disorder - Plan: citalopram  (CELEXA ) 20 MG tablet daily. Doing well on this and wants to continue. Refill x 1 year provided.   Screening for cervical cancer - Normal Pap history.  Will repeat at 5-year interval per guidelines.   Screening for breast cancer - Normal mammogram history.  Continue annual screenings.  Normal breast exam today. Family history of  breast cancer - Mother deceased from breast cancer, 3 maternal aunts and 2 maternal cousins with breast cancer history.  Patient BRCA negative. Lifetime breast cancer risk of 19%.  Screening for colon cancer - Normal colonoscopy in 2019.  She will follow-up with GI's recommended interval.  Return in about 1 year (around 08/06/2024) for Annual.        Jill Kennedy [facility], 3:16 PM 08/07/2023

## 2023-08-21 ENCOUNTER — Other Ambulatory Visit: Payer: Self-pay | Admitting: Nurse Practitioner

## 2023-08-21 DIAGNOSIS — F5101 Primary insomnia: Secondary | ICD-10-CM

## 2023-08-21 NOTE — Telephone Encounter (Signed)
 Medication refill request: Ambien  Last AEX:  08/07/23 Next AEX: not scheduled  Last MMG (if hormonal medication request): n/a Refill authorized: please advise

## 2023-10-29 ENCOUNTER — Other Ambulatory Visit: Payer: Self-pay | Admitting: Nurse Practitioner

## 2023-10-29 DIAGNOSIS — F5101 Primary insomnia: Secondary | ICD-10-CM

## 2023-10-29 NOTE — Telephone Encounter (Signed)
 Med refill request: Ambien Last AEX: 08/07/2023 Next AEX: not yet scheduled Last MMG (if hormonal med) 05/08/2022 Refill authorized: Last Rx sent #30 with zero refills on 08/21/2023. Please approve or deny as appropriate.

## 2024-01-01 ENCOUNTER — Other Ambulatory Visit: Payer: Self-pay | Admitting: Nurse Practitioner

## 2024-01-01 DIAGNOSIS — F5101 Primary insomnia: Secondary | ICD-10-CM

## 2024-01-01 NOTE — Telephone Encounter (Signed)
 Med refill request: Ambien   Last AEX: 08/07/23 Next AEX:not scheduled  Last MMG (if hormonal med) 05/08/22 BIRADS Cat 1 neg  Refill authorized: Last rx 10/29/23 #30 with 0 refills. Please approve or deny.

## 2024-03-13 ENCOUNTER — Other Ambulatory Visit: Payer: Self-pay

## 2024-03-13 ENCOUNTER — Other Ambulatory Visit: Payer: Self-pay | Admitting: Nurse Practitioner

## 2024-03-13 DIAGNOSIS — F5101 Primary insomnia: Secondary | ICD-10-CM

## 2024-03-13 NOTE — Telephone Encounter (Signed)
 Med refill request: Zolpidem  10 mg tab. Last AEX:  08/07/23 Next AEX: Not scheduled yet Last MMG (if hormonal med)? N/A Last refill: Last Rx sent on  5-27/25: #30 tab with 0 refills.  Refill authorized? Please approve or deny. Please Advise.

## 2024-05-06 ENCOUNTER — Other Ambulatory Visit: Payer: Self-pay | Admitting: Nurse Practitioner

## 2024-05-06 DIAGNOSIS — F5101 Primary insomnia: Secondary | ICD-10-CM

## 2024-05-06 NOTE — Telephone Encounter (Signed)
 Med refill request:  zolpidem  (AMBIEN ) 10 MG tablet Disp: #30 tablet    Refills: 0 Last refill: 03/13/2024  Last AEX:  08/07/23 Next AEX:  Not yet scheduled  Refill authorized?  Please Advise.

## 2024-05-23 ENCOUNTER — Other Ambulatory Visit: Payer: Self-pay | Admitting: Family Medicine

## 2024-05-23 DIAGNOSIS — Z1231 Encounter for screening mammogram for malignant neoplasm of breast: Secondary | ICD-10-CM

## 2024-06-11 ENCOUNTER — Ambulatory Visit
Admission: RE | Admit: 2024-06-11 | Discharge: 2024-06-11 | Disposition: A | Discharge status: Home / Self Care | Payer: Self-pay | Source: Ambulatory Visit | Attending: Family Medicine | Admitting: Family Medicine

## 2024-06-11 DIAGNOSIS — Z1231 Encounter for screening mammogram for malignant neoplasm of breast: Secondary | ICD-10-CM

## 2024-07-17 ENCOUNTER — Other Ambulatory Visit: Payer: Self-pay | Admitting: Nurse Practitioner

## 2024-07-17 DIAGNOSIS — F5101 Primary insomnia: Secondary | ICD-10-CM

## 2024-07-18 NOTE — Telephone Encounter (Signed)
 Med refill request:   zolpidem  (AMBIEN ) 10 MG tablet  Start:  05/06/24 Disp:  30 tablets Refills:  0  Last AEX:  08/07/23 Next AEX:  08/12/24 Last MMG (if hormonal med):  N/A Refill authorized? Please Advise.

## 2024-08-12 ENCOUNTER — Encounter: Payer: Self-pay | Admitting: Nurse Practitioner

## 2024-08-12 ENCOUNTER — Ambulatory Visit (INDEPENDENT_AMBULATORY_CARE_PROVIDER_SITE_OTHER): Payer: Self-pay | Admitting: Nurse Practitioner

## 2024-08-12 VITALS — BP 110/68 | HR 77 | Ht 66.75 in | Wt 157.0 lb

## 2024-08-12 DIAGNOSIS — Z1331 Encounter for screening for depression: Secondary | ICD-10-CM

## 2024-08-12 DIAGNOSIS — Z780 Asymptomatic menopausal state: Secondary | ICD-10-CM

## 2024-08-12 DIAGNOSIS — F411 Generalized anxiety disorder: Secondary | ICD-10-CM

## 2024-08-12 DIAGNOSIS — Z01419 Encounter for gynecological examination (general) (routine) without abnormal findings: Secondary | ICD-10-CM | POA: Diagnosis not present

## 2024-08-12 DIAGNOSIS — Z113 Encounter for screening for infections with a predominantly sexual mode of transmission: Secondary | ICD-10-CM

## 2024-08-12 MED ORDER — CITALOPRAM HYDROBROMIDE 20 MG PO TABS: 20.0000 mg | ORAL_TABLET | Freq: Every day | ORAL | 3 refills | Status: AC

## 2024-08-12 NOTE — Progress Notes (Signed)
 "  Jill Kennedy 1966/01/14 [id number]   History:  59 y.o. H6E9987 presents for annual exam without GYN complaints. Postmenopausal - no HRT, no bleeding. Normal pap and mammogram history. Anxiety managed well on Celexa . Mother deceased from breast cancer, 3 maternal aunts and 2 maternal cousins with breast cancer history.  Patient BRCA negative. Lifetime breast cancer risk of 19%. Requests STD screening.   Gynecologic History Patient's last menstrual period was 01/28/2015.   Contraception/Family planning: post menopausal status Sexually active: Yes  Health Maintenance Last Pap: 06/23/2020. Results were: Normal neg HPV Last mammogram: 06/18/2024. Results were: Normal Last colonoscopy: 11/07/2017. Results were: Normal, 10-year recall Last Dexa: Not indicated     08/12/2024    3:25 PM  Depression screen PHQ 2/9  Decreased Interest 0  Down, Depressed, Hopeless 0  PHQ - 2 Score 0     Past medical history, past surgical history, family history and social history were all reviewed and documented in the EPIC chart. Boyfriend of 15 years. 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 53 yo son, moved to [place] with girlfriend.   ROS:  A ROS was performed and pertinent positives and negatives are included.  Exam:  Vitals:   08/12/24 1522  BP: 110/68  Pulse: 77  SpO2: 98%  Weight: 157 lb (71.2 kg)  Height: 5' 6.75 (1.695 m)       Body mass index is 24.77 kg/m.  General appearance:  Normal Thyroid :  Symmetrical, normal in size, without palpable masses or nodularity. Respiratory  Auscultation:  Clear without wheezing or rhonchi Cardiovascular  Auscultation:  Regular rate, without rubs, murmurs or gallops  Edema/varicosities:  Not grossly evident Abdominal  Soft,nontender, without masses, guarding or rebound.  Liver/spleen:  No organomegaly noted  Hernia:  None appreciated  Skin  Inspection:  Grossly normal   Breasts:  Examined lying and sitting. Bilateral implants noted.  Right: Without masses, retractions, discharge or axillary adenopathy.   Left: Without masses, retractions, discharge or axillary adenopathy. Pelvic: External genitalia:  no lesions              Urethra:  normal appearing urethra with no masses, tenderness or lesions              Bartholins and Skenes: normal                 Vagina: normal appearing vagina with normal color and discharge, no lesions              Cervix: no lesions Bimanual Exam:  Uterus:  no masses or tenderness              Adnexa: no mass, fullness, tenderness              Rectovaginal: Deferred              Anus:  normal, no lesions  Jill Kennedy, CMA present as chaperone.   Assessment/Plan:  59 y.o. [id number] for annual exam.   Well female exam with routine gynecological exam  Postmenopausal  Generalized anxiety disorder - Plan: citalopram  (CELEXA ) 20 MG tablet  Depression screening  Screening examination for STD (sexually transmitted disease) - Plan: SURESWAB CT/NG/T. vaginalis   Well female exam with routine gynecological exam - Education provided on SBEs, importance of preventative screenings, current guidelines, high calcium diet, regular exercise, and multivitamin daily. Labs with PCP.   Postmenopausal - no HRT, no bleeding  Generalized anxiety disorder -  Plan: citalopram  (CELEXA ) 20 MG tablet daily. Doing well on this and wants to continue. Refill x 1 year provided.   Depression screening - PHQ - 0  Screening examination for STD (sexually transmitted disease) - Plan: SURESWAB CT/NG/T. Vaginalis. Declines HIV, RPR.   Screening for cervical cancer - Normal Pap history.  Will repeat at 5-year interval per guidelines.   Screening for breast cancer - Normal mammogram history.  Continue annual screenings.  Normal breast exam today. Family history of breast cancer - Mother deceased from breast cancer, 3 maternal aunts and 2 maternal cousins with breast  cancer history.  Patient BRCA negative. Lifetime breast cancer risk of 19%.  Screening for colon cancer - Normal colonoscopy in 2019.  She will follow-up with GI's recommended interval.  Return in about 1 year (around 08/12/2025) for Annual.        Jill Kennedy, 3:46 PM 08/12/2024 "

## 2024-08-14 ENCOUNTER — Ambulatory Visit: Payer: Self-pay | Admitting: Nurse Practitioner

## 2024-08-14 LAB — SURESWAB CT/NG/T. VAGINALIS
C. trachomatis RNA, TMA: NOT DETECTED
N. gonorrhoeae RNA, TMA: NOT DETECTED
Trichomonas vaginalis RNA: NOT DETECTED
